# Patient Record
Sex: Male | Born: 1960 | Race: White | Hispanic: No | Marital: Married | Smoking: Current some day smoker
Health system: Southern US, Community
[De-identification: ages and names within clinical notes are randomized; demographics above are authoritative.]

## PROBLEM LIST (undated history)

## (undated) DIAGNOSIS — E785 Hyperlipidemia, unspecified: Secondary | ICD-10-CM

## (undated) DIAGNOSIS — K219 Gastro-esophageal reflux disease without esophagitis: Secondary | ICD-10-CM

## (undated) DIAGNOSIS — M109 Gout, unspecified: Secondary | ICD-10-CM

## (undated) HISTORY — DX: Gastro-esophageal reflux disease without esophagitis: K21.9

## (undated) HISTORY — DX: Gout, unspecified: M10.9

## (undated) HISTORY — DX: Hyperlipidemia, unspecified: E78.5

## (undated) HISTORY — PX: TONSILECTOMY, ADENOIDECTOMY, BILATERAL MYRINGOTOMY AND TUBES: SHX2538

---

## 2013-05-02 ENCOUNTER — Ambulatory Visit: Payer: Self-pay | Admitting: Gastroenterology

## 2014-03-13 DIAGNOSIS — M109 Gout, unspecified: Secondary | ICD-10-CM

## 2014-03-13 DIAGNOSIS — K219 Gastro-esophageal reflux disease without esophagitis: Secondary | ICD-10-CM

## 2014-03-13 DIAGNOSIS — E785 Hyperlipidemia, unspecified: Secondary | ICD-10-CM

## 2014-03-13 HISTORY — DX: Hyperlipidemia, unspecified: E78.5

## 2014-03-13 HISTORY — DX: Gout, unspecified: M10.9

## 2014-03-13 HISTORY — DX: Gastro-esophageal reflux disease without esophagitis: K21.9

## 2016-03-31 ENCOUNTER — Other Ambulatory Visit: Payer: Self-pay | Admitting: Family Medicine

## 2016-03-31 DIAGNOSIS — M5442 Lumbago with sciatica, left side: Principal | ICD-10-CM

## 2016-03-31 DIAGNOSIS — G8929 Other chronic pain: Secondary | ICD-10-CM

## 2016-04-13 ENCOUNTER — Ambulatory Visit
Admission: RE | Admit: 2016-04-13 | Discharge: 2016-04-13 | Disposition: A | Discharge status: Home / Self Care | Payer: Managed Care, Other (non HMO) | Source: Ambulatory Visit | Attending: Family Medicine | Admitting: Family Medicine

## 2016-04-13 DIAGNOSIS — M4806 Spinal stenosis, lumbar region: Secondary | ICD-10-CM | POA: Insufficient documentation

## 2016-04-13 DIAGNOSIS — M5442 Lumbago with sciatica, left side: Secondary | ICD-10-CM | POA: Insufficient documentation

## 2016-04-13 DIAGNOSIS — G8929 Other chronic pain: Secondary | ICD-10-CM | POA: Insufficient documentation

## 2017-02-01 ENCOUNTER — Emergency Department
Admission: EM | Admit: 2017-02-01 | Discharge: 2017-02-02 | Disposition: A | Discharge status: Home / Self Care | Payer: Managed Care, Other (non HMO) | Attending: Emergency Medicine | Admitting: Emergency Medicine

## 2017-02-01 ENCOUNTER — Emergency Department: Payer: Managed Care, Other (non HMO)

## 2017-02-01 DIAGNOSIS — R1032 Left lower quadrant pain: Secondary | ICD-10-CM | POA: Diagnosis present

## 2017-02-01 DIAGNOSIS — F17200 Nicotine dependence, unspecified, uncomplicated: Secondary | ICD-10-CM | POA: Diagnosis not present

## 2017-02-01 DIAGNOSIS — N200 Calculus of kidney: Secondary | ICD-10-CM | POA: Insufficient documentation

## 2017-02-01 LAB — COMPREHENSIVE METABOLIC PANEL
ALBUMIN: 3.4 g/dL — AB (ref 3.5–5.0)
ALK PHOS: 59 U/L (ref 38–126)
ALT: 18 U/L (ref 17–63)
ANION GAP: 9 (ref 5–15)
AST: 25 U/L (ref 15–41)
BUN: 48 mg/dL — ABNORMAL HIGH (ref 6–20)
CALCIUM: 8.8 mg/dL — AB (ref 8.9–10.3)
CO2: 23 mmol/L (ref 22–32)
CREATININE: 1.83 mg/dL — AB (ref 0.61–1.24)
Chloride: 106 mmol/L (ref 101–111)
GFR, EST AFRICAN AMERICAN: 47 mL/min — AB (ref >60)
GFR, EST NON AFRICAN AMERICAN: 40 mL/min — AB (ref >60)
Glucose, Bld: 179 mg/dL — ABNORMAL HIGH (ref 65–99)
Potassium: 4.3 mmol/L (ref 3.5–5.1)
Sodium: 138 mmol/L (ref 135–145)
Total Bilirubin: 0.7 mg/dL (ref 0.3–1.2)
Total Protein: 6.6 g/dL (ref 6.5–8.1)

## 2017-02-01 LAB — CBC
HCT: 37.6 % — ABNORMAL LOW (ref 40.0–52.0)
HEMOGLOBIN: 12.4 g/dL — AB (ref 13.0–18.0)
MCH: 27.8 pg (ref 26.0–34.0)
MCHC: 33.1 g/dL (ref 32.0–36.0)
MCV: 84.1 fL (ref 80.0–100.0)
PLATELETS: 257 10*3/uL (ref 150–440)
RBC: 4.47 MIL/uL (ref 4.40–5.90)
RDW: 13.4 % (ref 11.5–14.5)
WBC: 17.7 10*3/uL — AB (ref 3.8–10.6)

## 2017-02-01 LAB — TROPONIN I

## 2017-02-01 LAB — LIPASE, BLOOD: Lipase: 29 U/L (ref 11–51)

## 2017-02-01 MED ORDER — ONDANSETRON HCL 4 MG/2ML IJ SOLN
INTRAMUSCULAR | Status: AC
  Administered 2017-02-01: 4 mg via INTRAVENOUS
  Filled 2017-02-01: qty 2

## 2017-02-01 MED ORDER — SODIUM CHLORIDE 0.9 % IV BOLUS (SEPSIS)
1000.0000 mL | Freq: Once | INTRAVENOUS | Status: AC
  Administered 2017-02-01: 1000 mL via INTRAVENOUS

## 2017-02-01 MED ORDER — ONDANSETRON HCL 4 MG/2ML IJ SOLN
4.0000 mg | Freq: Once | INTRAMUSCULAR | Status: AC
  Administered 2017-02-01: 4 mg via INTRAVENOUS

## 2017-02-01 MED ORDER — MORPHINE SULFATE (PF) 2 MG/ML IV SOLN
2.0000 mg | Freq: Once | INTRAVENOUS | Status: AC
  Administered 2017-02-01: 2 mg via INTRAVENOUS
  Filled 2017-02-01: qty 1

## 2017-02-01 MED ORDER — IOPAMIDOL (ISOVUE-300) INJECTION 61%
75.0000 mL | Freq: Once | INTRAVENOUS | Status: AC | PRN
  Administered 2017-02-01: 75 mL via INTRAVENOUS

## 2017-02-01 NOTE — ED Provider Notes (Signed)
[facility] Emergency Department Provider Note    First MD Initiated Contact with Patient 02/01/17 2306     (approximate)  I have reviewed the triage vital signs and the nursing notes.   HISTORY  Chief Complaint Abdominal Pain and Dizziness    HPI Juan Christian is a 56 y.o. male presents to the emergency department with left flank/left lower quadrant abdominal pain that is currently 4 out of 10 hour patient states at maximum intensity pain was 10 out of 10. Patient states that this pain started on Sunday and had improved until last night. Patient admits to vomiting however no diarrhea does admit to constipation. Patient denies any fever. Patient denies any urinary symptoms including hematuria dysuria urgency or frequency. Patient denies any personal history of kidney stones or diverticulitis however does admit to a family history of both.  Past medical history Hyperlipidemia There are no active problems to display for this patient.   Past surgical history None  Prior to Admission medications   Medication Sig Start Date End Date Taking? Authorizing Provider  ondansetron (ZOFRAN ODT) 4 MG disintegrating tablet Take 1 tablet (4 mg total) by mouth every 8 (eight) hours as needed for nausea or vomiting. 02/02/17   Darci Current, MD  oxyCODONE-acetaminophen (ROXICET) 5-325 MG tablet Take 1 tablet by mouth every 4 (four) hours as needed for severe pain. 02/02/17   Darci Current, MD  tamsulosin (FLOMAX) 0.4 MG CAPS capsule Take 1 capsule (0.4 mg total) by mouth daily. 02/02/17   Darci Current, MD    Allergies No known drug allergies   Family history Kidney stones  Social History Social History  Substance Use Topics  . Smoking status: Current Some Day Smoker  . Smokeless tobacco: Never Used  . Alcohol use No    Review of Systems {Constitutional: No fever/chills Eyes: No visual changes. ENT: No sore throat. Cardiovascular: Denies chest  pain. Respiratory: Denies shortness of breath. Gastrointestinal: Positive for left lower quadrant/flank abdominal pain and vomiting. Genitourinary: Negative for dysuria. Musculoskeletal: Negative for neck pain.  Negative for back pain. Integumentary: Negative for rash. Neurological: Negative for headaches, focal weakness or numbness. Positive for dizziness   ____________________________________________   PHYSICAL EXAM:  VITAL SIGNS: ED Triage Vitals  Enc Vitals Group     BP 02/01/17 2235 109/75     Pulse Rate 02/01/17 2235 (!) 125     Resp 02/01/17 2235 18     Temp 02/01/17 2235 97.8 F (36.6 C)     Temp Source 02/01/17 2235 Oral     SpO2 02/01/17 2235 97 %     Weight 02/01/17 2225 126.1 kg (278 lb)     Height 02/01/17 2225 1.676 m (5\' 6" )     Head Circumference --      Peak Flow --      Pain Score 02/01/17 2225 2     Pain Loc --      Pain Edu? --    Constitutional: Alert and oriented. Well appearing and in no acute distress. Eyes: Conjunctivae are normal. PERRL. EOMI. Head: Atraumatic. Mouth/Throat: Mucous membranes are moist.  Oropharynx non-erythematous. Neck: No stridor.  Cardiovascular: Normal rate, regular rhythm. Good peripheral circulation. Grossly normal heart sounds. Respiratory: Normal respiratory effort.  No retractions. Lungs CTAB. Gastrointestinal: Soft and nontender. No distention.  Musculoskeletal: No lower extremity tenderness nor edema. No gross deformities of extremities. Neurologic:  Normal speech and language. No gross focal neurologic deficits are appreciated.  Skin:  Skin is warm, dry and intact. No rash noted. Psychiatric: Mood and affect are normal. Speech and behavior are normal.  ____________________________________________   LABS (all labs ordered are listed, but only abnormal results are displayed)  Labs Reviewed  COMPREHENSIVE METABOLIC PANEL - Abnormal; Notable for the following:       Result Value   Glucose, Bld 179 (*)    BUN 48  (*)    Creatinine, Ser 1.83 (*)    Calcium 8.8 (*)    Albumin 3.4 (*)    GFR calc non Af Amer 40 (*)    GFR calc Af Amer 47 (*)    All other components within normal limits  CBC - Abnormal; Notable for the following:    WBC 17.7 (*)    Hemoglobin 12.4 (*)    HCT 37.6 (*)    All other components within normal limits  URINALYSIS, COMPLETE (UACMP) WITH MICROSCOPIC - Abnormal; Notable for the following:    Color, Urine STRAW (*)    APPearance CLEAR (*)    Specific Gravity, Urine 1.039 (*)    Ketones, ur 5 (*)    Squamous Epithelial / LPF 0-5 (*)    All other components within normal limits  LIPASE, BLOOD  TROPONIN I   ____________________________________________  EKG  ED ECG REPORT I, Diablock N BROWN, the attending physician, personally viewed and interpreted this ECG.   Date: 02/01/2017  EKG Time: 10:36 PM  Rate: 122  Rhythm: Sinus tachycardia  Axis: Normal  Intervals: Normal  ST&T Change: None  ____________________________________________  RADIOLOGY I, Alger N BROWN, personally viewed and evaluated these images (plain radiographs) as part of my medical decision making, as well as reviewing the written report by the radiologist.  Ct Abdomen Pelvis W Contrast  Result Date: 02/02/2017 CLINICAL DATA:  Left lower quadrant pain with vomiting EXAM: CT ABDOMEN AND PELVIS WITH CONTRAST TECHNIQUE: Multidetector CT imaging of the abdomen and pelvis was performed using the standard protocol following bolus administration of intravenous contrast. CONTRAST:  75mL ISOVUE-300 IOPAMIDOL (ISOVUE-300) INJECTION 61% COMPARISON:  MRI  04/13/2016 FINDINGS: Lower chest: Low lung bases demonstrate no acute consolidation or pleural effusion. Normal heart size. Trace pericardial effusion or thickening Hepatobiliary: Hepatic steatosis. No calcified gallstones or biliary dilatation Pancreas: Unremarkable. No pancreatic ductal dilatation or surrounding inflammatory changes. Spleen: Normal in size  without focal abnormality. Adrenals/Urinary Tract: Adrenal glands are within normal limits. Delayed nephrogram on the left with poor excretion of contrast on the delayed views. Mild left hydronephrosis and hydroureter, secondary to a 4 mm stone within the distal left ureter, just above the left UVJ. Soft tissue stranding in the left retroperitoneum. Bladder normal. Stomach/Bowel: Frothy debris in the stomach. No dilated small bowel. Normal appendix. Sigmoid colon diverticula without acute inflammation Vascular/Lymphatic: No significant vascular findings are present. No enlarged abdominal or pelvic lymph nodes. Reproductive: Prostate is unremarkable. Other: Small fat in the left inguinal canal. Tiny fat in the umbilicus. 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 Chronic compression deformity of L1. IMPRESSION: 1. Mild left hydronephrosis and hydroureter, secondary to a 4 mm stone in the distal left ureter just above the left UVJ. Delayed left nephrogram and poor excretion of contrast, likely due to the obstruction. 2. Sigmoid colon diverticular disease without acute inflammation 3. Grade 1 anterolisthesis of L5 on S1 with chronic bilateral pars defect at L5. Electronically Signed   By: Adrian ProwsKim  Fujinaga M.D.  On: 02/02/2017 00:06      Procedures   ____________________________________________   INITIAL IMPRESSION / ASSESSMENT AND PLAN / ED COURSE  Pertinent labs & imaging results that were available during my care of the patient were reviewed by me and considered in my medical decision making (see chart for details).  Patient given IV morphine and Zofran emergency Department with improvement of pain. History of physical exam concerning for possible nephro/ureterolithiasis which was confirmed with CT scan. Patient will be referred to allergist on call for further outpatient management.       ____________________________________________  FINAL CLINICAL IMPRESSION(S) / ED DIAGNOSES  Final diagnoses:  Kidney stone on left side     MEDICATIONS GIVEN DURING THIS VISIT:  Medications  sodium chloride 0.9 % bolus 1,000 mL (0 mLs Intravenous Stopped 02/02/17 0054)  sodium chloride 0.9 % bolus 1,000 mL (0 mLs Intravenous Stopped 02/02/17 0054)  morphine 2 MG/ML injection 2 mg (2 mg Intravenous Given 02/01/17 2320)  ondansetron (ZOFRAN) injection 4 mg (4 mg Intravenous Given 02/01/17 2320)  iopamidol (ISOVUE-300) 61 % injection 75 mL (75 mLs Intravenous Contrast Given 02/01/17 2338)  sodium chloride 0.9 % bolus 1,000 mL (0 mLs Intravenous Stopped 02/02/17 0258)     NEW OUTPATIENT MEDICATIONS STARTED DURING THIS VISIT:  Discharge Medication List as of 02/02/2017  2:48 AM    START taking these medications   Details  ondansetron (ZOFRAN ODT) 4 MG disintegrating tablet Take 1 tablet (4 mg total) by mouth every 8 (eight) hours as needed for nausea or vomiting., Starting Thu 02/02/2017, Print    oxyCODONE-acetaminophen (ROXICET) 5-325 MG tablet Take 1 tablet by mouth every 4 (four) hours as needed for severe pain., Starting Thu 02/02/2017, Print    tamsulosin (FLOMAX) 0.4 MG CAPS capsule Take 1 capsule (0.4 mg total) by mouth daily., Starting Thu 02/02/2017, Print        Discharge Medication List as of 02/02/2017  2:48 AM      Discharge Medication List as of 02/02/2017  2:48 AM       Note:  This document was prepared using Dragon voice recognition software and may include unintentional dictation errors.    Darci Current, MD 02/02/17 [phone]

## 2017-02-01 NOTE — ED Notes (Signed)
Patient transported to CT 

## 2017-02-02 LAB — URINALYSIS, COMPLETE (UACMP) WITH MICROSCOPIC
BACTERIA UA: NONE SEEN
Bilirubin Urine: NEGATIVE
GLUCOSE, UA: NEGATIVE mg/dL
Hgb urine dipstick: NEGATIVE
Ketones, ur: 5 mg/dL — AB
Leukocytes, UA: NEGATIVE
Nitrite: NEGATIVE
PROTEIN: NEGATIVE mg/dL
RBC / HPF: NONE SEEN RBC/hpf (ref 0–5)
Specific Gravity, Urine: 1.039 — ABNORMAL HIGH (ref 1.005–1.030)
pH: 5 (ref 5.0–8.0)

## 2017-02-02 MED ORDER — SODIUM CHLORIDE 0.9 % IV BOLUS (SEPSIS)
1000.0000 mL | Freq: Once | INTRAVENOUS | Status: AC
  Administered 2017-02-02: 1000 mL via INTRAVENOUS

## 2017-02-02 MED ORDER — ONDANSETRON 4 MG PO TBDP: 4.0000 mg | ORAL_TABLET | Freq: Three times a day (TID) | ORAL | 0 refills | Status: DC | PRN

## 2017-02-02 MED ORDER — OXYCODONE-ACETAMINOPHEN 5-325 MG PO TABS: 1.0000 | ORAL_TABLET | ORAL | 0 refills | Status: DC | PRN

## 2017-02-02 MED ORDER — TAMSULOSIN HCL 0.4 MG PO CAPS: 0.4000 mg | ORAL_CAPSULE | Freq: Every day | ORAL | 0 refills | Status: DC

## 2017-02-09 ENCOUNTER — Encounter: Payer: Self-pay | Admitting: Urology

## 2017-02-09 ENCOUNTER — Ambulatory Visit (INDEPENDENT_AMBULATORY_CARE_PROVIDER_SITE_OTHER): Payer: Managed Care, Other (non HMO) | Admitting: Urology

## 2017-02-09 VITALS — BP 129/81 | HR 109 | Ht 66.0 in | Wt 276.3 lb

## 2017-02-09 DIAGNOSIS — N400 Enlarged prostate without lower urinary tract symptoms: Secondary | ICD-10-CM

## 2017-02-09 DIAGNOSIS — N200 Calculus of kidney: Secondary | ICD-10-CM

## 2017-02-09 LAB — URINALYSIS, COMPLETE
Bilirubin, UA: NEGATIVE
Glucose, UA: NEGATIVE
Ketones, UA: NEGATIVE
LEUKOCYTES UA: NEGATIVE
NITRITE UA: NEGATIVE
PH UA: 6 (ref 5.0–7.5)
Protein, UA: NEGATIVE
RBC UA: NEGATIVE
Specific Gravity, UA: 1.015 (ref 1.005–1.030)
Urobilinogen, Ur: 0.2 mg/dL (ref 0.2–1.0)

## 2017-02-09 LAB — MICROSCOPIC EXAMINATION
BACTERIA UA: NONE SEEN
EPITHELIAL CELLS (NON RENAL): NONE SEEN /HPF (ref 0–10)

## 2017-02-09 MED ORDER — TAMSULOSIN HCL 0.4 MG PO CAPS: 0.4000 mg | ORAL_CAPSULE | Freq: Every day | ORAL | 11 refills | Status: DC

## 2017-02-09 NOTE — Progress Notes (Signed)
02/09/2017 12:13 PM   Juan GashMark R Daoust 31-Aug-1960 [id number]  Referring provider: Marina GoodellFeldpausch, Dale E, MD [address] [place], [place] [id number]  Chief Complaint  Patient presents with  . Nephrolithiasis    HPI: The patient is a 56 year old gentle man presents today for follow-up after being diagnosed in the emergency department with a 4 mm left UVJ stone.  Ureteral present with sudden onset left flank pain. He also had significant nausea and vomiting. He denied fevers or chills. His pain is since resolved. He has not seen the stone passed though he has not been straining his urine. He has no previous history of nephrolithiasis though he does have a family history in his father. Currently she remains asymptomatic.  The patient was given a short course of Flomax to help him pass the stone. He is requesting to restart this medication because he saw a significant improvement in his urinary issues and specifically with his nocturia. Completely resolve his nocturia, improved his urinary stream, and gave a better sensation of incomplete bladder emptying. He has never been on medication for BPH prior to this. He saw immediate return of his symptoms once he ran out of his short course of Flomax that was given for his ureteral calculus.   PMH: History reviewed. No pertinent past medical history.  Surgical History: Past Surgical History:  Procedure Laterality Date  . TONSILECTOMY, ADENOIDECTOMY, BILATERAL MYRINGOTOMY AND TUBES      Home Medications:  Allergies as of 02/09/2017   No Known Allergies     Medication List       Accurate as of 02/09/17 12:13 PM. Always use your most recent med list.          ondansetron 4 MG disintegrating tablet Commonly known as:  ZOFRAN ODT Take 1 tablet (4 mg total) by mouth every 8 (eight) hours as needed for nausea or vomiting.   oxyCODONE-acetaminophen 5-325 MG tablet Commonly known as:  ROXICET Take 1 tablet by mouth every 4 (four) hours as needed  for severe pain.   tamsulosin 0.4 MG Caps capsule Commonly known as:  FLOMAX Take 1 capsule (0.4 mg total) by mouth daily.   tamsulosin 0.4 MG Caps capsule Commonly known as:  FLOMAX Take 1 capsule (0.4 mg total) by mouth daily.       Allergies: No Known Allergies  Family History: Family History  Problem Relation Age of Onset  . Prostate cancer Neg Hx   . Bladder Cancer Neg Hx   . Kidney cancer Neg Hx     Social History:  reports that he has been smoking.  He has never used smokeless tobacco. He reports that he does not drink alcohol or use drugs.  ROS: UROLOGY Frequent Urination?: No Hard to postpone urination?: Yes Burning/pain with urination?: No Get up at night to urinate?: Yes Leakage of urine?: Yes Urine stream starts and stops?: No Trouble starting stream?: No Do you have to strain to urinate?: No Blood in urine?: No Urinary tract infection?: No Sexually transmitted disease?: No Injury to kidneys or bladder?: No Painful intercourse?: No Weak stream?: No Erection problems?: No Penile pain?: No  Gastrointestinal Nausea?: Yes Vomiting?: Yes Indigestion/heartburn?: Yes Diarrhea?: No Constipation?: No  Constitutional Fever: No Night sweats?: No Weight loss?: No Fatigue?: No  Skin Skin rash/lesions?: No Itching?: No  Eyes Blurred vision?: No Double vision?: No  Ears/Nose/Throat Sore throat?: No Sinus problems?: No  Hematologic/Lymphatic Swollen glands?: No Easy bruising?: No  Cardiovascular Leg swelling?: No Chest pain?:  No  Respiratory Cough?: No Shortness of breath?: No  Endocrine Excessive thirst?: No  Musculoskeletal Back pain?: No Joint pain?: No  Neurological Headaches?: No Dizziness?: No  Psychologic Depression?: No Anxiety?: No  Physical Exam: BP 129/81 (BP Location: Left Arm, Patient Position: Sitting, Cuff Size: Normal)   Pulse (!) 109   Ht 5\' 6"  (1.676 m)   Wt 276 lb 4.8 oz (125.3 kg)   BMI 44.60 kg/m     Constitutional:  Alert and oriented, No acute distress. HEENT: [place] AT, moist mucus membranes.  Trachea midline, no masses. Cardiovascular: No clubbing, cyanosis, or edema. Respiratory: Normal respiratory effort, no increased work of breathing. GI: Abdomen is soft, nontender, nondistended, no abdominal masses GU: No CVA tenderness.  Skin: No rashes, bruises or suspicious lesions. Lymph: No cervical or inguinal adenopathy. Neurologic: Grossly intact, no focal deficits, moving all 4 extremities. Psychiatric: Normal mood and affect.  Laboratory Data: Lab Results  Component Value Date   WBC 17.7 (H) 02/01/2017   HGB 12.4 (L) 02/01/2017   HCT 37.6 (L) 02/01/2017   MCV 84.1 02/01/2017   PLT 257 02/01/2017    Lab Results  Component Value Date   CREATININE 1.83 (H) 02/01/2017    No results found for: PSA  No results found for: TESTOSTERONE  No results found for: HGBA1C  Urinalysis    Component Value Date/Time   COLORURINE STRAW (A) 02/01/2017 2237   APPEARANCEUR CLEAR (A) 02/01/2017 2237   LABSPEC 1.039 (H) 02/01/2017 2237   PHURINE 5.0 02/01/2017 2237   GLUCOSEU NEGATIVE 02/01/2017 2237   HGBUR NEGATIVE 02/01/2017 2237   BILIRUBINUR NEGATIVE 02/01/2017 2237   KETONESUR 5 (A) 02/01/2017 2237   PROTEINUR NEGATIVE 02/01/2017 2237   NITRITE NEGATIVE 02/01/2017 2237   LEUKOCYTESUR NEGATIVE 02/01/2017 2237    Pertinent Imaging: CT images and report reviewed as above.  Assessment & Plan:    1. Left 4 mm UVJ stone It is unclear if the patient has passed his stone at this point though he remains asymptomatic. He was given a strainer. We will have him undergo a KUB in approximately 2 weeks in follow-up at that point. He will strain his urine and bring in the stone if he is able to retrieve it.  2. BPH We'll reinstitute Flomax as the patient noted dramatic improvement in his urinary symptoms when this medication was prescribed for obstructive uropathy.  Return in about 2  weeks (around 02/23/2017) for KUB prior.  Hildred Laser, MD  [facility] [address] [place], [place] [address] [phone]

## 2017-02-24 ENCOUNTER — Ambulatory Visit
Admission: RE | Admit: 2017-02-24 | Discharge: 2017-02-24 | Disposition: A | Discharge status: Home / Self Care | Payer: Managed Care, Other (non HMO) | Source: Ambulatory Visit | Attending: Urology | Admitting: Urology

## 2017-02-24 ENCOUNTER — Ambulatory Visit (INDEPENDENT_AMBULATORY_CARE_PROVIDER_SITE_OTHER): Payer: Managed Care, Other (non HMO) | Admitting: Urology

## 2017-02-24 VITALS — BP 127/87 | HR 114 | Resp 12 | Ht 67.0 in | Wt 278.0 lb

## 2017-02-24 DIAGNOSIS — Z87442 Personal history of urinary calculi: Secondary | ICD-10-CM | POA: Insufficient documentation

## 2017-02-24 DIAGNOSIS — N200 Calculus of kidney: Secondary | ICD-10-CM

## 2017-02-24 DIAGNOSIS — N400 Enlarged prostate without lower urinary tract symptoms: Secondary | ICD-10-CM | POA: Diagnosis not present

## 2017-02-24 NOTE — Progress Notes (Signed)
02/24/2017 3:23 PM   Juan Christian Oct 29, 1960 [id number]  Referring provider: Marina GoodellFeldpausch, Dale E, MD [address] [place], [place] [id number]  Chief Complaint  Patient presents with  . Follow-up    KUB prior    HPI: The patient is a 56 year old gentle man presents today for follow-up of his 4 mm left UVJ stone.  Patient remains asymptomatic at this point. KUB from today shows no evidence of nephrolithiasis. This was his first stone episode of there is a family history his father for nephrolithiasis.  The patient is also currently on Flomax. This initially started for his nephrolithiasis. He noticed a dramatic improvement in his urinary symptoms on this medication, so it has been continued.   PMH: No past medical history on file.  Surgical History: Past Surgical History:  Procedure Laterality Date  . TONSILECTOMY, ADENOIDECTOMY, BILATERAL MYRINGOTOMY AND TUBES      Home Medications:  Allergies as of 02/24/2017   No Known Allergies     Medication List       Accurate as of 02/24/17  3:23 PM. Always use your most recent med list.          tamsulosin 0.4 MG Caps capsule Commonly known as:  FLOMAX Take 1 capsule (0.4 mg total) by mouth daily.       Allergies: No Known Allergies  Family History: Family History  Problem Relation Age of Onset  . Prostate cancer Neg Hx   . Bladder Cancer Neg Hx   . Kidney cancer Neg Hx     Social History:  reports that he has been smoking.  He has never used smokeless tobacco. He reports that he does not drink alcohol or use drugs.  ROS: UROLOGY Frequent Urination?: No Hard to postpone urination?: No Burning/pain with urination?: No Get up at night to urinate?: Yes Leakage of urine?: Yes Urine stream starts and stops?: No Trouble starting stream?: No Do you have to strain to urinate?: No Blood in urine?: No Urinary tract infection?: No Sexually transmitted disease?: No Injury to kidneys or bladder?: No Painful  intercourse?: No Weak stream?: No Erection problems?: No Penile pain?: Yes  Gastrointestinal Nausea?: No Vomiting?: No Indigestion/heartburn?: No Diarrhea?: No Constipation?: No  Constitutional Fever: No Night sweats?: No Weight loss?: No Fatigue?: No  Skin Skin rash/lesions?: No Itching?: No  Eyes Blurred vision?: No Double vision?: No  Ears/Nose/Throat Sore throat?: No Sinus problems?: No  Hematologic/Lymphatic Swollen glands?: No Easy bruising?: No  Cardiovascular Leg swelling?: No Chest pain?: No  Respiratory Cough?: No Shortness of breath?: No  Endocrine Excessive thirst?: No  Musculoskeletal Back pain?: No Joint pain?: No  Neurological Headaches?: No Dizziness?: No  Psychologic Depression?: No Anxiety?: No  Physical Exam: BP 127/87   Pulse (!) 114   Resp 12   Ht 5\' 7"  (1.702 m)   Wt 278 lb (126.1 kg)   BMI 43.54 kg/m   Constitutional:  Alert and oriented, No acute distress. HEENT:  AT, moist mucus membranes.  Trachea midline, no masses. Cardiovascular: No clubbing, cyanosis, or edema. Respiratory: Normal respiratory effort, no increased work of breathing. GI: Abdomen is soft, nontender, nondistended, no abdominal masses GU: No CVA tenderness.  Skin: No rashes, bruises or suspicious lesions. Lymph: No cervical or inguinal adenopathy. Neurologic: Grossly intact, no focal deficits, moving all 4 extremities. Psychiatric: Normal mood and affect.  Laboratory Data: Lab Results  Component Value Date   WBC 17.7 (H) 02/01/2017   HGB 12.4 (L) 02/01/2017   HCT  37.6 (L) 02/01/2017   MCV 84.1 02/01/2017   PLT 257 02/01/2017    Lab Results  Component Value Date   CREATININE 1.83 (H) 02/01/2017    No results found for: PSA  No results found for: TESTOSTERONE  No results found for: HGBA1C  Urinalysis    Component Value Date/Time   COLORURINE STRAW (A) 02/01/2017 2237   APPEARANCEUR Clear 02/09/2017 1131   LABSPEC 1.039 (H)  02/01/2017 2237   PHURINE 5.0 02/01/2017 2237   GLUCOSEU Negative 02/09/2017 1131   HGBUR NEGATIVE 02/01/2017 2237   BILIRUBINUR Negative 02/09/2017 1131   KETONESUR 5 (A) 02/01/2017 2237   PROTEINUR Negative 02/09/2017 1131   PROTEINUR NEGATIVE 02/01/2017 2237   NITRITE Negative 02/09/2017 1131   NITRITE NEGATIVE 02/01/2017 2237   LEUKOCYTESUR Negative 02/09/2017 1131    Pertinent Imaging: KUB and previous CT reviewed as above.  Assessment & Plan:   1. Left ureteral stone This is clinically passed based on patient's symptoms and KUB today. If his pain however were to return, we could order a renal ultrasound at that time.  2. BPH Continue Flomax. Follow up annually.  Return in about 1 year (around 02/24/2018).  Hildred Laser, MD  [facility] [address] [place], [place] [address] [phone]

## 2017-05-15 ENCOUNTER — Telehealth: Payer: Self-pay

## 2017-05-15 DIAGNOSIS — N200 Calculus of kidney: Secondary | ICD-10-CM

## 2017-05-15 NOTE — Telephone Encounter (Signed)
Pt wife called the after hours triage line stating pt is having left flank and groin pain again. Wife stated that she is with the understanding pt KUB was negative.  Returned wife's call. Wife stated that pt has been pushing fluids and feels some better but really sore in flank and groin area now. Wife stated that pt has not seen a stone. Reinforced with wife per Dr. Jorja Loa dictation from July if pain returns can orders a RUS. Wife stated that she would prefer pt to have a RUS and if its negative then great. Reinforced with wife RUS orders and scheduling process. Wife voiced understanding. RUS orders placed.

## 2017-06-02 ENCOUNTER — Ambulatory Visit
Admission: RE | Admit: 2017-06-02 | Discharge: 2017-06-02 | Disposition: A | Discharge status: Home / Self Care | Payer: Managed Care, Other (non HMO) | Source: Ambulatory Visit | Attending: Urology | Admitting: Urology

## 2017-06-02 DIAGNOSIS — N200 Calculus of kidney: Secondary | ICD-10-CM | POA: Diagnosis not present

## 2017-06-08 ENCOUNTER — Telehealth: Payer: Self-pay | Admitting: Family Medicine

## 2017-06-08 NOTE — Telephone Encounter (Signed)
Marcelino DusterMichelle spoke with patients wife and gave results. She voiced understanding. They had no questions.

## 2017-06-08 NOTE — Telephone Encounter (Signed)
-----   Message from Hildred LaserBrian James Budzyn, MD sent at 06/08/2017  1:01 PM EDT ----- Please let patient know renal u/s is normal.   ----- Message ----- From: Mervin KungWilliams, Ramona K, CMA Sent: 06/02/2017   4:27 PM To: Hildred LaserBrian James Budzyn, MD    ----- Message ----- From: Interface, Rad Results In Sent: 06/02/2017   4:17 PM To: Jennette KettleBua Clinical

## 2018-03-01 ENCOUNTER — Ambulatory Visit: Payer: Managed Care, Other (non HMO)

## 2018-03-08 NOTE — Progress Notes (Signed)
03/09/2018 9:45 AM   Juan Christian Dec 27, 1960 [id number]  Referring provider: Marina Goodell, MD [address] [place], [place] [address]  Chief Complaint  Patient presents with  . Benign Prostatic Hypertrophy    HPI: Patient is a 57 year old gentleman with a history of nephrolithiasis and BPH with LU TS who presents today for a one year follow up.  History of nephrolithiasis Spontaneously passed a 4 mm left UVJ stone in 01/2017.  Stone composition is unknown.  His father and mother have had kidney stones.  He is fearful that the stone has not passed as he never saw it come out.  He would like another RUS.    BPH WITH LUTS  (prostate and/or bladder) IPSS score: 8/3   PVR: 0 mL     Major complaint(s):  Works the night shift, mild urge incontinence and a weak stream x a while.  Denies any dysuria, hematuria or suprapubic pain.   He had been on the tamsulosin 0.4 mg daily, he has been without it for one month.    Denies any recent fevers, chills, nausea or vomiting.  His father had his prostate removed due to BPH.    IPSS    Row Name 03/09/18 0900         International Prostate Symptom Score   How often have you had the sensation of not emptying your bladder?  Less than 1 in 5     How often have you found you stopped and started again several times when you urinated?  Not at All     How often have you found it difficult to postpone urination?  Less than 1 in 5 times     How often have you had a weak urinary stream?  About half the time     How often have you had to strain to start urination?  Less than 1 in 5 times     How many times did you typically get up at night to urinate?  2 Times     Total IPSS Score  8       Quality of Life due to urinary symptoms   If you were to spend the rest of your life with your urinary condition just the way it is now how would you feel about that?  Mixed        Score:  1-7 Mild 8-19 Moderate 20-35 Severe    PMH: Past  Medical History:  Diagnosis Date  . GERD (gastroesophageal reflux disease) 03/13/2014  . Gout 03/13/2014  . Hyperlipidemia 03/13/2014    Surgical History: Past Surgical History:  Procedure Laterality Date  . TONSILECTOMY, ADENOIDECTOMY, BILATERAL MYRINGOTOMY AND TUBES      Home Medications:  Allergies as of 03/09/2018   No Known Allergies     Medication List        Accurate as of 03/09/18  9:45 AM. Always use your most recent med list.          simvastatin 10 MG tablet Commonly known as:  ZOCOR TAKE 1 TABLET BY MOUTH NIGHTLY   tamsulosin 0.4 MG Caps capsule Commonly known as:  FLOMAX Take 1 capsule (0.4 mg total) by mouth daily.       Allergies: No Known Allergies  Family History: Family History  Problem Relation Age of Onset  . Prostate cancer Neg Hx   . Bladder Cancer Neg Hx   . Kidney cancer Neg Hx     Social History:  reports that he has been smoking.  He has never used smokeless tobacco. He reports that he does not drink alcohol or use drugs.  ROS: UROLOGY Frequent Urination?: No Hard to postpone urination?: No Burning/pain with urination?: No Get up at night to urinate?: Yes Leakage of urine?: Yes Urine stream starts and stops?: No Trouble starting stream?: No Do you have to strain to urinate?: No Blood in urine?: No Urinary tract infection?: No Sexually transmitted disease?: No Injury to kidneys or bladder?: No Painful intercourse?: No Weak stream?: Yes Erection problems?: No Penile pain?: No  Gastrointestinal Nausea?: No Vomiting?: No Indigestion/heartburn?: No Diarrhea?: No Constipation?: No  Constitutional Fever: No Night sweats?: No Weight loss?: No Fatigue?: No  Skin Skin rash/lesions?: No Itching?: No  Eyes Blurred vision?: No Double vision?: No  Ears/Nose/Throat Sore throat?: No Sinus problems?: No  Hematologic/Lymphatic Swollen glands?: No Easy bruising?: No  Cardiovascular Leg swelling?: No Chest pain?:  No  Respiratory Cough?: No Shortness of breath?: No  Endocrine Excessive thirst?: No  Musculoskeletal Back pain?: No Joint pain?: No  Neurological Headaches?: No Dizziness?: No  Psychologic Depression?: No Anxiety?: No  Physical Exam: BP (!) 139/98   Pulse 86   Ht 5\' 6"  (1.676 m)   Wt 270 lb (122.5 kg)   BMI 43.58 kg/m   Constitutional: Well nourished. Alert and oriented, No acute distress. HEENT: [place] AT, moist mucus membranes. Trachea midline, no masses. Cardiovascular: No clubbing, cyanosis, or edema. Respiratory: Normal respiratory effort, no increased work of breathing. GI: Abdomen is soft, non tender, non distended, no abdominal masses. Liver and spleen not palpable.  No hernias appreciated.  Stool sample for occult testing is not indicated.   GU: No CVA tenderness.  No bladder fullness or masses.  Patient with uncircumcised phallus.  Foreskin easily retracted  Urethral meatus is patent.  No penile discharge. No penile lesions or rashes. Scrotum without lesions, cysts, rashes and/or edema.  Testicles are located scrotally bilaterally. No masses are appreciated in the testicles. Left and right epididymis are normal. Rectal: Patient with  normal sphincter tone. Anus and perineum without scarring or rashes. No rectal masses are appreciated. Prostate is approximately 50 grams, could only palpate apex and midportion of gland due to buttock tissue, no nodules are appreciated. Seminal vesicles are normal. Skin: No rashes, bruises or suspicious lesions. Lymph: No cervical or inguinal adenopathy. Neurologic: Grossly intact, no focal deficits, moving all 4 extremities. Psychiatric: Normal mood and affect.  Laboratory Data: PSA   1.74 in 04/14/2017 Lab Results  Component Value Date   WBC 17.7 (H) 02/01/2017   HGB 12.4 (L) 02/01/2017   HCT 37.6 (L) 02/01/2017   MCV 84.1 02/01/2017   PLT 257 02/01/2017    Lab Results  Component Value Date   CREATININE 1.83 (H)  02/01/2017    No results found for: PSA  No results found for: TESTOSTERONE  No results found for: HGBA1C  Urinalysis    Component Value Date/Time   COLORURINE STRAW (A) 02/01/2017 2237   APPEARANCEUR Clear 02/09/2017 1131   LABSPEC 1.039 (H) 02/01/2017 2237   PHURINE 5.0 02/01/2017 2237   GLUCOSEU Negative 02/09/2017 1131   HGBUR NEGATIVE 02/01/2017 2237   BILIRUBINUR Negative 02/09/2017 1131   KETONESUR 5 (A) 02/01/2017 2237   PROTEINUR Negative 02/09/2017 1131   PROTEINUR NEGATIVE 02/01/2017 2237   NITRITE Negative 02/09/2017 1131   NITRITE NEGATIVE 02/01/2017 2237   LEUKOCYTESUR Negative 02/09/2017 1131    I have reviewed the labs.  Pertinent  Imaging Results for AUNDREA, HIGGINBOTHAM (MRN [id number]) as of 03/09/2018 09:46  Ref. Range 03/09/2018 09:44  Scan Result Unknown 0ml    Assessment & Plan:   1. History of nephrolithiasis Patient is concerned about silent hydronephrosis as he had never captured the stone and he has heard this can happen Obtain a RUS - will call with results   2. BPH with LUTS IPSS score is 8/3 Continue conservative management, avoiding bladder irritants and timed voiding's Most bothersome symptoms is/are weak stream Continue tamsulosin 0.4 mg daily:refills given RTC in 12 months for I PSS and exam PSA followed by PCP  Return in about 1 year (around 03/10/2019) for IPSS and exam.  Michiel Cowboy, [facility]  [facility] [address] [place], [place] [address] [phone]

## 2018-03-09 ENCOUNTER — Encounter: Payer: Self-pay | Admitting: Urology

## 2018-03-09 ENCOUNTER — Ambulatory Visit (INDEPENDENT_AMBULATORY_CARE_PROVIDER_SITE_OTHER): Payer: Managed Care, Other (non HMO) | Admitting: Urology

## 2018-03-09 VITALS — BP 139/98 | HR 86 | Ht 66.0 in | Wt 270.0 lb

## 2018-03-09 DIAGNOSIS — N138 Other obstructive and reflux uropathy: Secondary | ICD-10-CM | POA: Diagnosis not present

## 2018-03-09 DIAGNOSIS — N401 Enlarged prostate with lower urinary tract symptoms: Secondary | ICD-10-CM | POA: Diagnosis not present

## 2018-03-09 DIAGNOSIS — Z87442 Personal history of urinary calculi: Secondary | ICD-10-CM

## 2018-03-09 LAB — BLADDER SCAN AMB NON-IMAGING

## 2018-03-09 MED ORDER — TAMSULOSIN HCL 0.4 MG PO CAPS: 0.4000 mg | ORAL_CAPSULE | Freq: Every day | ORAL | 3 refills | Status: DC

## 2018-04-11 ENCOUNTER — Ambulatory Visit
Admission: RE | Admit: 2018-04-11 | Discharge: 2018-04-11 | Disposition: A | Discharge status: Home / Self Care | Payer: Managed Care, Other (non HMO) | Source: Ambulatory Visit | Attending: Urology | Admitting: Urology

## 2018-04-11 DIAGNOSIS — Z87442 Personal history of urinary calculi: Secondary | ICD-10-CM

## 2018-04-11 DIAGNOSIS — N200 Calculus of kidney: Secondary | ICD-10-CM | POA: Diagnosis not present

## 2018-04-12 ENCOUNTER — Telehealth: Payer: Self-pay

## 2018-04-12 NOTE — Telephone Encounter (Signed)
Called pt informed him of the information below. Pt gave vernal understanding.

## 2018-04-12 NOTE — Telephone Encounter (Signed)
-----   Message from Harle Battiest, PA-C sent at 04/11/2018  3:36 PM EDT ----- Please let Juan Christian know that his renal ultrasound did not reveal any swelling in his kidneys.  He does have a stone in his lower right kidney.  He should have an x-ray prior to his office visit in one year.

## 2018-08-04 IMAGING — CT CT ABD-PELV W/ CM
2 of 5 series · 16 of 46 positions shown, 18 images · IV contrast (APPLIED)
Comparison: MRI  04/13/2016

CLINICAL DATA: Left lower quadrant pain with vomiting

EXAM:
CT ABDOMEN AND PELVIS WITH CONTRAST
TECHNIQUE: Multidetector CT imaging of the abdomen and pelvis was performed
using the standard protocol following bolus administration of
intravenous contrast.
CONTRAST:  75mL Y853MX-DHH IOPAMIDOL (Y853MX-DHH) INJECTION 61%

[Series 2: routine abd/pel with · axial · 0.96mm/px · z∈[-850,-370]mm · 13 of 108 slices shown, 15 images]
[im 6/108  soft-tissue]
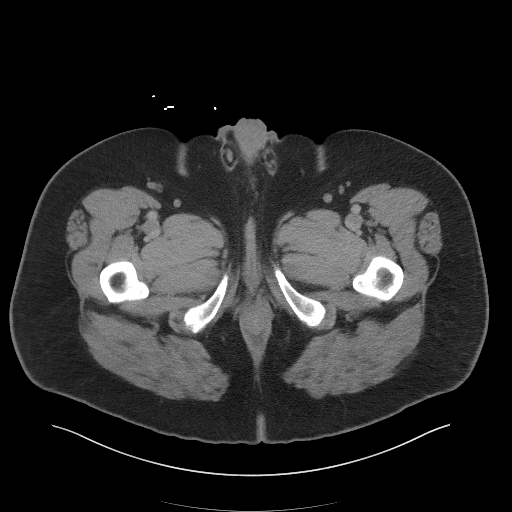
[im 6/108  bone]
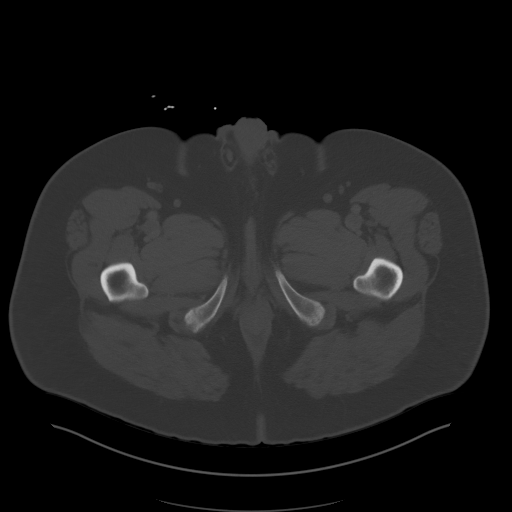
[im 12/108  soft-tissue]
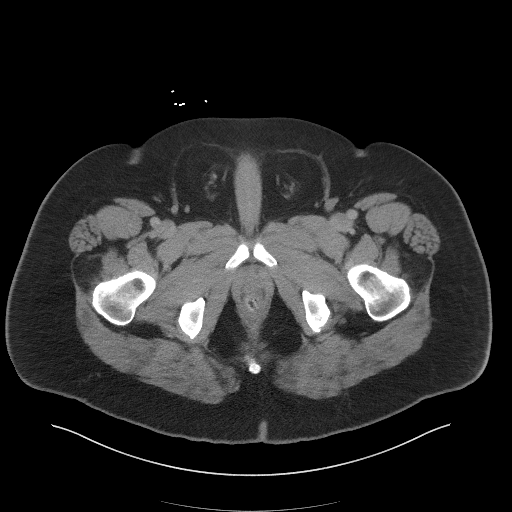
[im 24/108  soft-tissue]
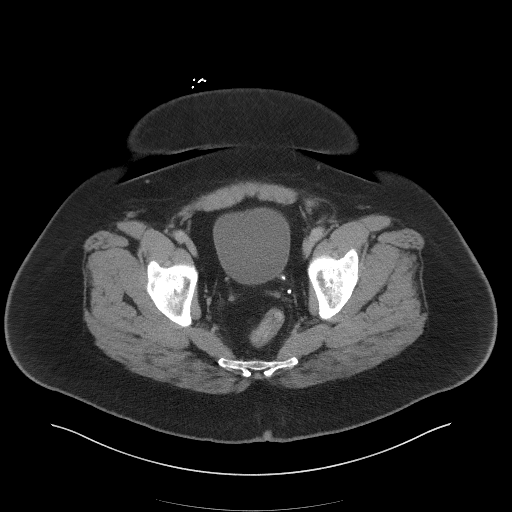
[im 30/108  soft-tissue]
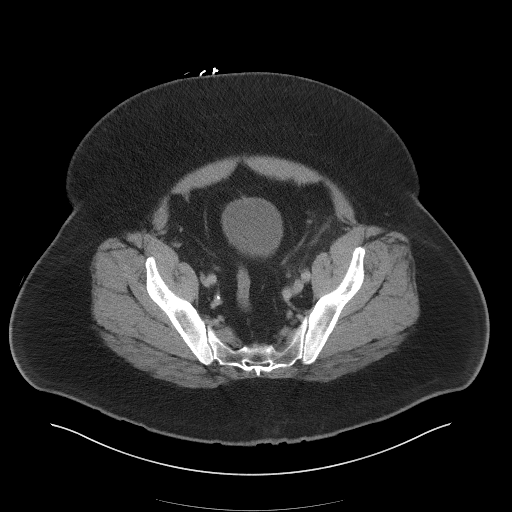
[im 36/108  soft-tissue]
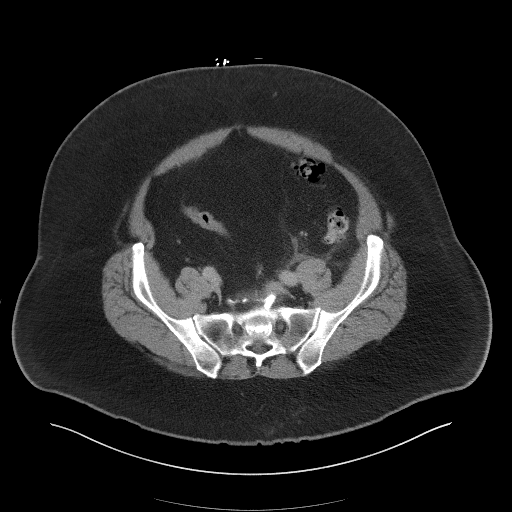
[im 48/108  soft-tissue]
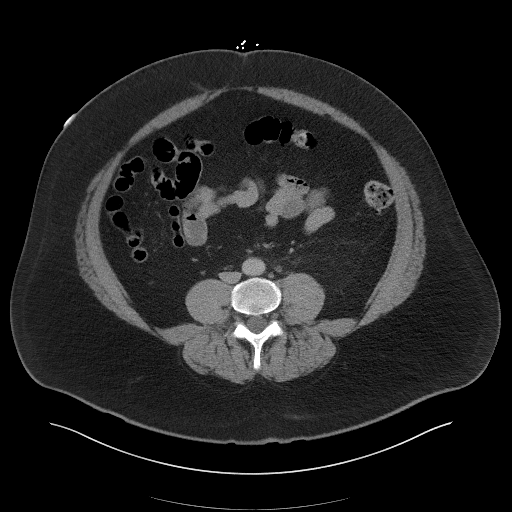
[im 54/108  soft-tissue]
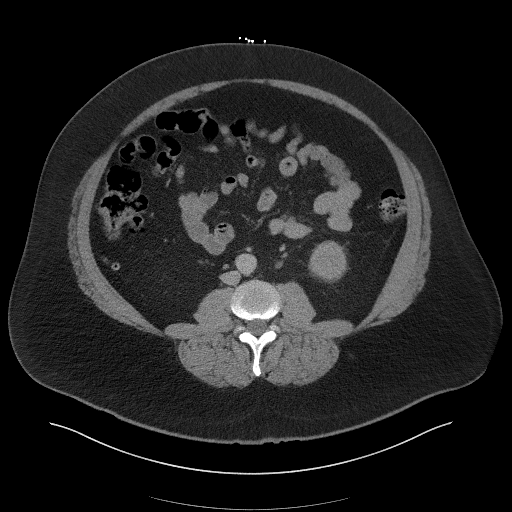
[im 60/108  soft-tissue]
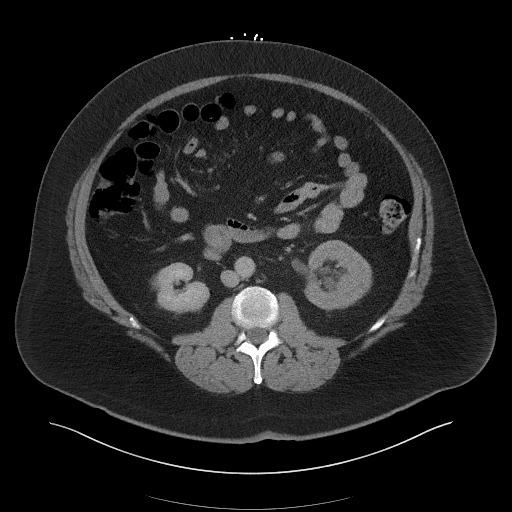
[im 72/108  soft-tissue]
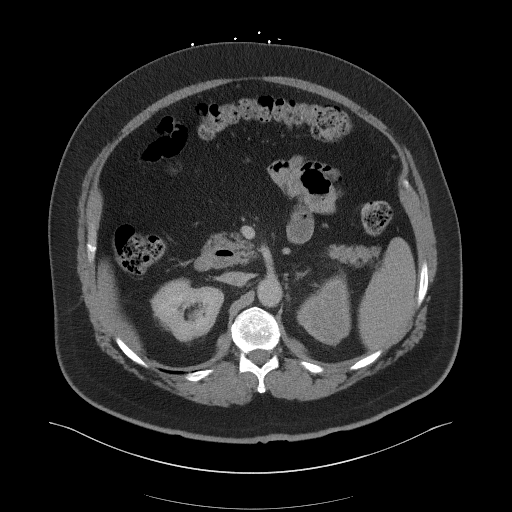
[im 72/108  bone]
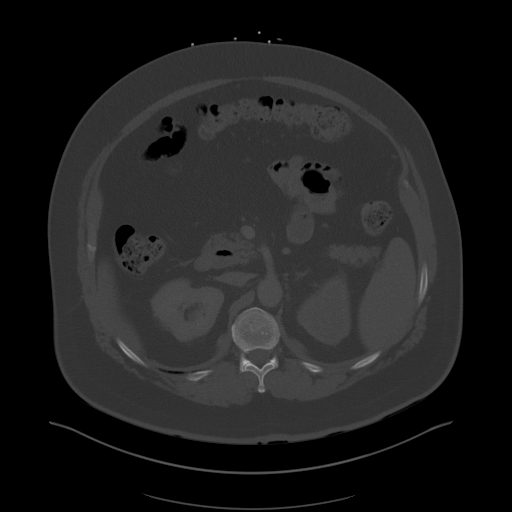
[im 78/108  soft-tissue]
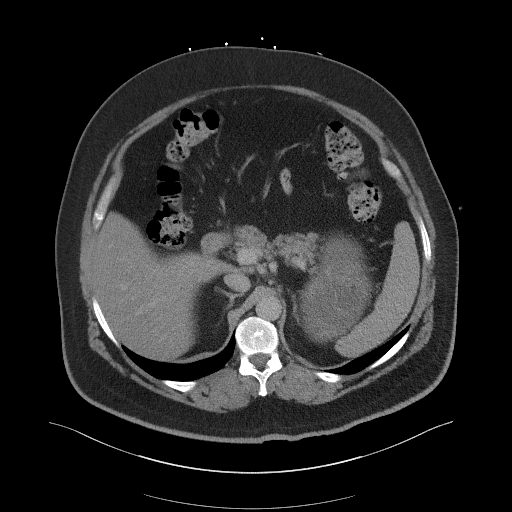
[im 84/108  soft-tissue]
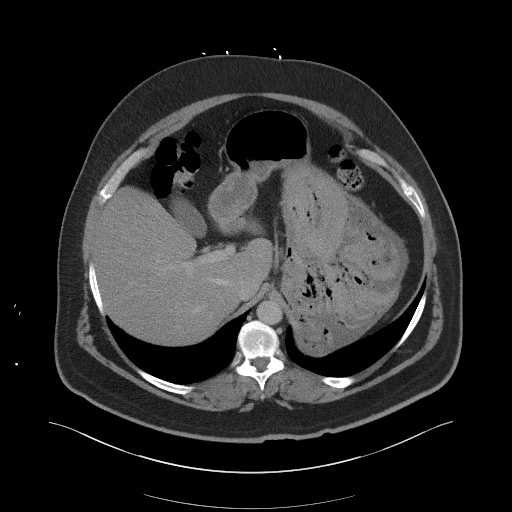
[im 96/108  soft-tissue]
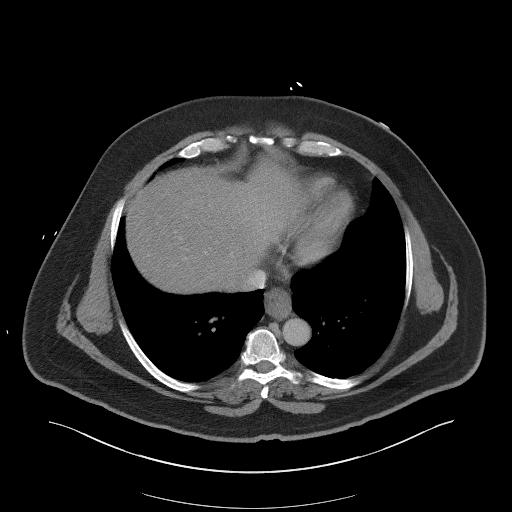
[im 102/108  soft-tissue]
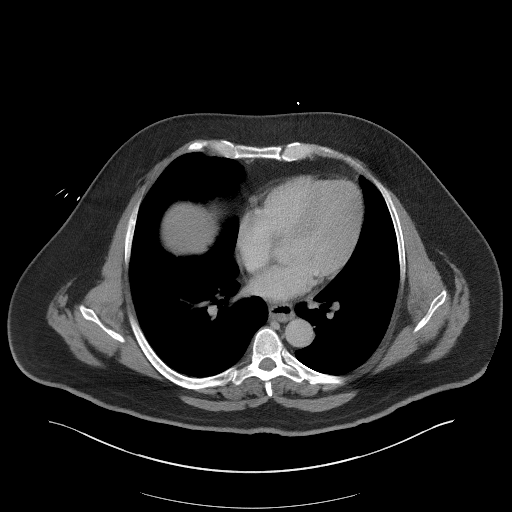

[Series 5: coronal st · coronal · 0.85mm/px · 3 of 122 slices shown]
[im 41/122  soft-tissue]
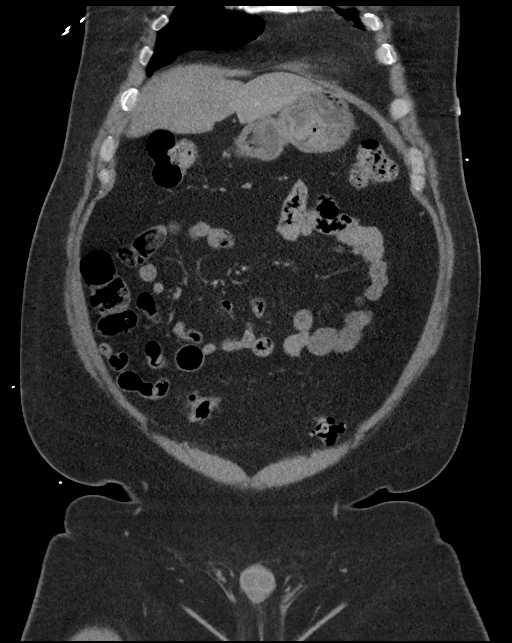
[im 54/122  soft-tissue]
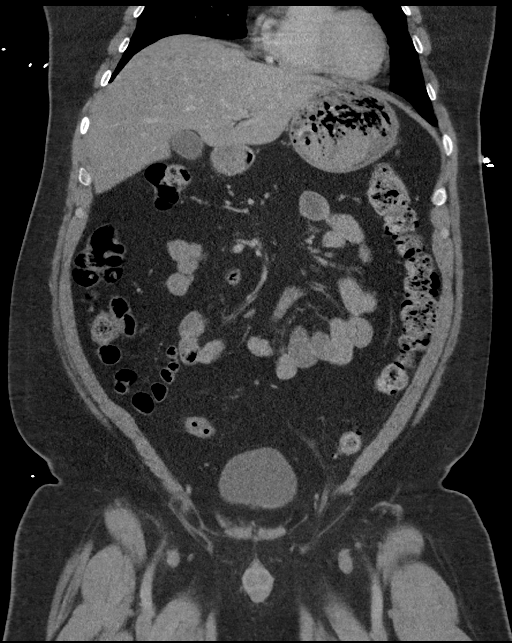
[im 68/122  soft-tissue]
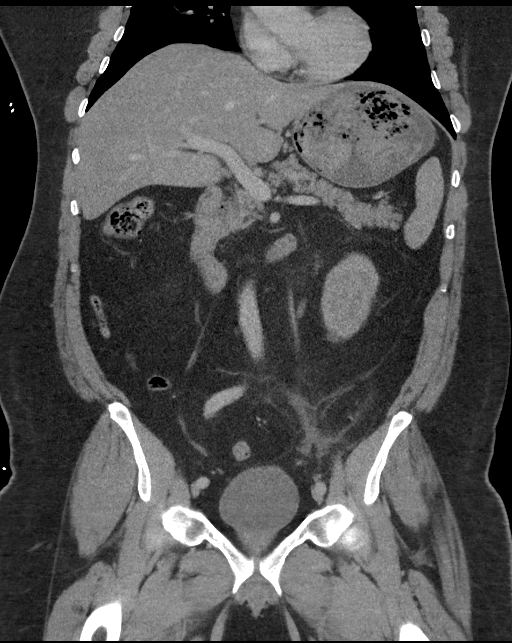

[16 of 46 positions shown; findings below may reference images not displayed]

FINDINGS: Lower chest: Low lung bases demonstrate no acute consolidation or
pleural effusion. Normal heart size. Trace pericardial effusion or
thickening

Hepatobiliary: Hepatic steatosis. No calcified gallstones or biliary
dilatation

Pancreas: Unremarkable. No pancreatic ductal dilatation or
surrounding inflammatory changes.

Spleen: Normal in size without focal abnormality.

Adrenals/Urinary Tract: Adrenal glands are within normal limits.
Delayed nephrogram on the left with poor excretion of contrast on
the delayed views. Mild left hydronephrosis and hydroureter,
secondary to a 4 mm stone within the distal left ureter, just above
the left UVJ. Soft tissue stranding in the left retroperitoneum.
Bladder normal.

Stomach/Bowel: Frothy debris in the stomach. No dilated small bowel.
Normal appendix. Sigmoid colon diverticula without acute
inflammation

Vascular/Lymphatic: No significant vascular findings are present. No
enlarged abdominal or pelvic lymph nodes.

Reproductive: Prostate is unremarkable.

Other: Small fat in the left inguinal canal. Tiny fat in the
umbilicus. No free air or free fluid

Musculoskeletal: Grade 1 anterolisthesis of L5 on S1 with chronic
bilateral pars defect at L5. Chronic compression deformity of L1.
IMPRESSION: 1. Mild left hydronephrosis and hydroureter, secondary to a 4 mm
stone in the distal left ureter just above the left UVJ. Delayed
left nephrogram and poor excretion of contrast, likely due to the
obstruction.
2. Sigmoid colon diverticular disease without acute inflammation
3. Grade 1 anterolisthesis of L5 on S1 with chronic bilateral pars
defect at L5.

## 2019-01-09 ENCOUNTER — Other Ambulatory Visit: Payer: Self-pay | Admitting: Urology

## 2019-03-13 NOTE — Progress Notes (Deleted)
03/14/2019 9:25 AM   Juan Christian 10/27/1960 [id number]  Referring provider: Sofie Hartigan, [place],  [place] [address]  No chief complaint on file.   HPI: Patient is a 58 year old gentleman with a history of nephrolithiasis and BPH with LU TS who presents today for a one year follow up.  History of nephrolithiasis Spontaneously passed a 4 mm left UVJ stone in 01/2017.  Stone composition is unknown.  His father and mother have had kidney stones.  RUS in 04/2018 revealed a nonobstructing 4.6 mm lower pole stone on the right. Normal appearing left kidney. Normal appearing urinary bladder.  KUB today ***.    BPH WITH LUTS  (prostate and/or bladder) I PSS score: ***          PVR: ***          Previous IPSS score: 8/3   PVR: 0 mL     Major complaint(s):  Works the night shift, mild urge incontinence and a weak stream x a while.  Denies any dysuria, hematuria or suprapubic pain.   He had been on the tamsulosin 0.4 mg daily, he has been without it for one month.    Denies any recent fevers, chills, nausea or vomiting.  His father had his prostate removed due to BPH.      Score:  1-7 Mild 8-19 Moderate 20-35 Severe    PMH: Past Medical History:  Diagnosis Date  . GERD (gastroesophageal reflux disease) 03/13/2014  . Gout 03/13/2014  . Hyperlipidemia 03/13/2014    Surgical History: Past Surgical History:  Procedure Laterality Date  . TONSILECTOMY, ADENOIDECTOMY, BILATERAL MYRINGOTOMY AND TUBES      Home Medications:  Allergies as of 03/14/2019   No Known Allergies     Medication List       Accurate as of March 13, 2019  9:25 AM. If you have any questions, ask your nurse or doctor.        simvastatin 10 MG tablet Commonly known as: ZOCOR TAKE 1 TABLET BY MOUTH NIGHTLY   tamsulosin 0.4 MG Caps capsule Commonly known as: FLOMAX TAKE 1 CAPSULE BY MOUTH ONCE DAILY       Allergies: No Known Allergies  Family History: Family History   Problem Relation Age of Onset  . Prostate cancer Neg Hx   . Bladder Cancer Neg Hx   . Kidney cancer Neg Hx     Social History:  reports that he has been smoking. He has never used smokeless tobacco. He reports that he does not drink alcohol or use drugs.  ROS:                                        Physical Exam: There were no vitals taken for this visit.  Constitutional:  Well nourished. Alert and oriented, No acute distress. HEENT:  AT, moist mucus membranes.  Trachea midline, no masses. Cardiovascular: No clubbing, cyanosis, or edema. Respiratory: Normal respiratory effort, no increased work of breathing. GI: Abdomen is soft, non tender, non distended, no abdominal masses. Liver and spleen not palpable.  No hernias appreciated.  Stool sample for occult testing is not indicated.   GU: No CVA tenderness.  No bladder fullness or masses.  Patient with circumcised/uncircumcised phallus. ***Foreskin easily retracted***  Urethral meatus is patent.  No penile discharge. No penile lesions or rashes. Scrotum without lesions, cysts,  rashes and/or edema.  Testicles are located scrotally bilaterally. No masses are appreciated in the testicles. Left and right epididymis are normal. Rectal: Patient with  normal sphincter tone. Anus and perineum without scarring or rashes. No rectal masses are appreciated. Prostate is approximately *** grams, *** nodules are appreciated. Seminal vesicles are normal. Skin: No rashes, bruises or suspicious lesions. Lymph: No cervical or inguinal adenopathy. Neurologic: Grossly intact, no focal deficits, moving all 4 extremities. Psychiatric: Normal mood and affect.  Laboratory Data: PSA   1.74 in 04/14/2017  1.86 in 04/2018  Lab Results  Component Value Date   WBC 17.7 (H) 02/01/2017   HGB 12.4 (L) 02/01/2017   HCT 37.6 (L) 02/01/2017   MCV 84.1 02/01/2017   PLT 257 02/01/2017    Lab Results  Component Value Date   CREATININE 1.83  (H) 02/01/2017    No results found for: PSA  No results found for: TESTOSTERONE  No results found for: HGBA1C  Urinalysis    Component Value Date/Time   COLORURINE STRAW (A) 02/01/2017 2237   APPEARANCEUR Clear 02/09/2017 1131   LABSPEC 1.039 (H) 02/01/2017 2237   PHURINE 5.0 02/01/2017 2237   GLUCOSEU Negative 02/09/2017 1131   HGBUR NEGATIVE 02/01/2017 2237   BILIRUBINUR Negative 02/09/2017 1131   KETONESUR 5 (A) 02/01/2017 2237   PROTEINUR Negative 02/09/2017 1131   PROTEINUR NEGATIVE 02/01/2017 2237   NITRITE Negative 02/09/2017 1131   NITRITE NEGATIVE 02/01/2017 2237   LEUKOCYTESUR Negative 02/09/2017 1131    I have reviewed the labs.  Pertinent Imaging ***   Assessment & Plan:   1. Right nephrolithiasis ***   2. BPH with LUTS IPSS score is 8/3 Continue conservative management, avoiding bladder irritants and timed voiding's Most bothersome symptoms is/are weak stream Continue tamsulosin 0.4 mg daily:refills given RTC in 12 months for I PSS and exam PSA followed by PCP  No follow-ups on file.  Michiel CowboySHANNON Pietrina Jagodzinski, PA-C  [facility] [address] [place], [place] [address] [phone]

## 2019-03-14 ENCOUNTER — Ambulatory Visit: Payer: Managed Care, Other (non HMO) | Admitting: Urology

## 2020-01-09 ENCOUNTER — Other Ambulatory Visit: Payer: Self-pay | Admitting: Urology

## 2020-02-08 ENCOUNTER — Other Ambulatory Visit: Payer: Self-pay | Admitting: Urology

## 2020-02-26 ENCOUNTER — Other Ambulatory Visit: Payer: Self-pay | Admitting: Urology

## 2020-06-16 NOTE — Progress Notes (Signed)
co   06/17/2020 11:31 AM   Juan Christian [birth year] [id number]  Referring provider: Sofie Hartigan, [place],  [place] [address]  Chief Complaint  Patient presents with  . Follow-up    HPI: Juan Christian is a 59 y.o. male with a PMH of nephrolithiasis and BPH with LU TS who presents today for urinary issues.    BPH WITH LUTS  (prostate and/or bladder) IPSS score: 14/3   PVR: 24 mL     Major complaint(s):  Frequency, urgency, incontinence and a weak/split urinary stream x three years. Denies any dysuria, hematuria or suprapubic pain.   Currently taking: tamsulosin, but prescription expired   Denies any recent fevers, chills, nausea or vomiting.  He does not have a family history of PCa.   IPSS    Row Name 06/17/20 1400         International Prostate Symptom Score   How often have you had the sensation of not emptying your bladder? Less than half the time     How often have you had to urinate less than every two hours? Less than 1 in 5 times     How often have you found you stopped and started again several times when you urinated? Less than 1 in 5 times     How often have you found it difficult to postpone urination? About half the time     How often have you had a weak urinary stream? About half the time     How often have you had to strain to start urination? Less than 1 in 5 times     How many times did you typically get up at night to urinate? 3 Times     Total IPSS Score 14       Quality of Life due to urinary symptoms   If you were to spend the rest of your life with your urinary condition just the way it is now how would you feel about that? Mixed            Score:  1-7 Mild 8-19 Moderate 20-35 Severe  PMH: Past Medical History:  Diagnosis Date  . GERD (gastroesophageal reflux disease) 03/13/2014  . Gout 03/13/2014  . Hyperlipidemia 03/13/2014    Surgical History: Past Surgical History:  Procedure Laterality Date  .  TONSILECTOMY, ADENOIDECTOMY, BILATERAL MYRINGOTOMY AND TUBES      Home Medications:  Allergies as of 06/17/2020   No Known Allergies     Medication List       Accurate as of June 17, 2020 11:59 PM. If you have any questions, ask your nurse or doctor.        cefdinir 300 MG capsule Commonly known as: OMNICEF Take by mouth.   simvastatin 10 MG tablet Commonly known as: ZOCOR TAKE 1 TABLET BY MOUTH NIGHTLY   tamsulosin 0.4 MG Caps capsule Commonly known as: FLOMAX Take two tablets daily What changed:   how much to take  how to take this  when to take this  additional instructions Changed by: Kourtnei Rauber, PA-C       Allergies: No Known Allergies  Family History: Family History  Problem Relation Age of Onset  . Prostate cancer Neg Hx   . Bladder Cancer Neg Hx   . Kidney cancer Neg Hx     Social History:  reports that he has been smoking. He has never used smokeless tobacco. He reports that he does not drink  alcohol and does not use drugs.  ROS: Pertinent ROS in HPI  Physical Exam: BP (!) 156/103 (BP Location: Left Arm, Patient Position: Sitting, Cuff Size: Large)   Pulse (!) 106   Ht $R'5\' 6"'Lt$  (1.676 m)   Wt 276 lb (125.2 kg)   BMI 44.55 kg/m   Constitutional:  Well nourished. Alert and oriented, No acute distress. HEENT: [place] AT, mask in place.  Trachea midline Cardiovascular: No clubbing, cyanosis, or edema. Respiratory: Normal respiratory effort, no increased work of breathing. GU: No CVA tenderness.  No bladder fullness or masses.  Patient with uncircumcised phallus. Foreskin easily retracted  Urethral meatus is patent.  No penile discharge. No penile lesions or rashes. Scrotum without lesions, cysts, rashes and/or edema.  Testicles are located scrotally bilaterally. No masses are appreciated in the testicles. Left and right epididymis are normal. Rectal: Patient with  normal sphincter tone. Anus and perineum without scarring or rashes. No rectal  masses are appreciated. Prostate is approximately 50 grams, could only palpate the apex and the midportion of the gland, no  nodules are appreciated. Seminal vesicles could not be palpated Neurologic: Grossly intact, no focal deficits, moving all 4 extremities. Psychiatric: Normal mood and affect.  Laboratory Data: Related to PSA, Total (Screen) Component 02/20/20 05/09/19 04/19/18 04/14/17  PSA (Prostate Specific Antigen), Total 2.03 1.84 1.86 1.74   Specimen:  Blood  Ref Range & Units 3 mo ago  Glucose 70 - 110 mg/dL 90      Sodium 136 - 145 mmol/L 140      Potassium 3.6 - 5.1 mmol/L 4.6      Chloride 97 - 109 mmol/L 103      Carbon Dioxide (CO2) 22.0 - 32.0 mmol/L 29.2      Urea Nitrogen (BUN) 7 - 25 mg/dL 15      Creatinine 0.7 - 1.3 mg/dL 0.9      Glomerular Filtration Rate (eGFR), MDRD Estimate >60 mL/min/1.73sq m 87      Calcium 8.7 - 10.3 mg/dL 9.5      AST  8 - 39 U/L 24      ALT  6 - 57 U/L 31      Alk Phos (alkaline Phosphatase) 34 - 104 U/L 88      Albumin 3.5 - 4.8 g/dL 4.3      Bilirubin, Total 0.3 - 1.2 mg/dL 0.5      Protein, Total 6.1 - 7.9 g/dL 7.1      A/G Ratio 1.0 - 5.0 gm/dL 1.5      Resulting Agency  [facility] - [facility]  Specimen Collected: 02/20/20 2:08 PM Last Resulted: 02/21/20 11:59 AM  Received From: [facility]  Result Received: 06/16/20 2:14 PM   Specimen:  Blood  Ref Range & Units 3 mo ago  Cholesterol, Total 100 - 200 mg/dL 179      Triglyceride 35 - 199 mg/dL 199      HDL (High Density Lipoprotein) Cholesterol 29.0 - 71.0 mg/dL 44.8      LDL Calculated 0 - 130 mg/dL 94      VLDL Cholesterol mg/dL 40      Cholesterol/HDL Ratio  4.0      Resulting Agency  [facility] - [facility]  Specimen Collected: 02/20/20 2:08 PM Last Resulted: 02/21/20 11:59 AM  Received From: [facility]  Result Received: 06/16/20 2:14 PM     Specimen:  Blood  Ref Range & Units 3 mo ago  WBC  (  White Blood Cell Count) 4.1 - 10.2 10^3/uL 8.0      RBC (Red Blood Cell Count) 4.69 - 6.13 10^6/uL 5.77      Hemoglobin 14.1 - 18.1 gm/dL 16.0      Hematocrit 40.0 - 52.0 % 50.7      MCV (Mean Corpuscular Volume) 80.0 - 100.0 fl 87.9      MCH (Mean Corpuscular Hemoglobin) 27.0 - 31.2 pg 27.7      MCHC (Mean Corpuscular Hemoglobin Concentration) 32.0 - 36.0 gm/dL 31.6Low      Platelet Count 150 - 450 10^3/uL 169      RDW-CV (Red Cell Distribution Width) 11.6 - 14.8 % 13.3      MPV (Mean Platelet Volume) 9.4 - 12.4 fl 10.5      Neutrophils 1.50 - 7.80 10^3/uL 5.20      Lymphocytes 1.00 - 3.60 10^3/uL 2.20      Mixed Count 0.10 - 0.90 10^3/uL 0.60      Neutrophil % 32.0 - 70.0 % 65.3      Lymphocyte % 10.0 - 50.0 % 27.2      Mixed % 3.0 - 14.4 % 7.5      Resulting Agency  [facility] - [facility]  Specimen Collected: 02/20/20 2:08 PM Last Resulted: 02/20/20 2:32 PM  Received From: [facility]  Result Received: 06/16/20 2:14 PM   Specimen:  Blood  Ref Range & Units 3 mo ago  Thyroid Stimulating Hormone (TSH) 0.450-5.330 uIU/ml uIU/mL 3.031      Resulting Agency  [facility] - [facility]  Specimen Collected: 02/20/20 2:08 PM Last Resulted: 02/21/20 11:58 AM  Received From: [facility]  Result Received: 06/16/20 2:14 PM    Urinalysis Component     Latest Ref Rng & Units 06/17/2020  Specific Gravity, UA     1.005 - 1.030 1.020  pH, UA     5.0 - 7.5 5.0  Color, UA     Yellow Yellow  Appearance Ur     Clear Clear  Leukocytes,UA     Negative Negative  Protein,UA     Negative/Trace Negative  Glucose, UA     Negative Negative  Ketones, UA     Negative Negative  RBC, UA     Negative Negative  Bilirubin, UA     Negative Negative  Urobilinogen, Ur     0.2 - 1.0 mg/dL 0.2  Nitrite, UA     Negative Negative  Microscopic Examination      See below:   Component     Latest Ref Rng & Units 06/17/2020  WBC, UA      0 - 5 /hpf 0-5  RBC     0 - 2 /hpf None seen  Epithelial Cells (non renal)     0 - 10 /hpf 0-10  Bacteria, UA     None seen/Few None seen    I have reviewed the labs.   Pertinent Imaging: Results for DARTANIAN, KNAGGS (MRN [id number]) as of 07/08/2020 11:28  Ref. Range 06/17/2020 14:38  Scan Result Unknown 66mL   Assessment & Plan:    1. BPH with LU TS IPSS score is 14/3 Continue conservative management, avoiding bladder irritants and timed voiding's Most bothersome symptoms is/are frequency, urgency, incontinence and a weak/split urinary stream  Continue tamsulosin 0.4 mg daily - refills given RTC in 1 month for I PSS and PVR  Return in about 1 month (around 07/17/2020) for IPSS and PVR.  These notes generated with voice recognition software. I apologize for typographical errors.  Juan Council, PA-C  [facility] [address]  [place], [place] [address] [phone]

## 2020-06-17 ENCOUNTER — Ambulatory Visit (INDEPENDENT_AMBULATORY_CARE_PROVIDER_SITE_OTHER): Payer: Managed Care, Other (non HMO) | Admitting: Urology

## 2020-06-17 ENCOUNTER — Encounter: Payer: Self-pay | Admitting: Urology

## 2020-06-17 ENCOUNTER — Other Ambulatory Visit: Payer: Self-pay

## 2020-06-17 VITALS — BP 156/103 | HR 106 | Ht 66.0 in | Wt 276.0 lb

## 2020-06-17 DIAGNOSIS — N401 Enlarged prostate with lower urinary tract symptoms: Secondary | ICD-10-CM

## 2020-06-17 DIAGNOSIS — N138 Other obstructive and reflux uropathy: Secondary | ICD-10-CM | POA: Diagnosis not present

## 2020-06-17 LAB — BLADDER SCAN AMB NON-IMAGING

## 2020-06-17 MED ORDER — TAMSULOSIN HCL 0.4 MG PO CAPS: ORAL_CAPSULE | ORAL | 3 refills | Status: DC

## 2020-06-18 LAB — URINALYSIS, COMPLETE
Bilirubin, UA: NEGATIVE
Glucose, UA: NEGATIVE
Ketones, UA: NEGATIVE
Leukocytes,UA: NEGATIVE
Nitrite, UA: NEGATIVE
Protein,UA: NEGATIVE
RBC, UA: NEGATIVE
Specific Gravity, UA: 1.02 (ref 1.005–1.030)
Urobilinogen, Ur: 0.2 mg/dL (ref 0.2–1.0)
pH, UA: 5 (ref 5.0–7.5)

## 2020-06-18 LAB — MICROSCOPIC EXAMINATION
Bacteria, UA: NONE SEEN
RBC: NONE SEEN /hpf (ref 0–2)

## 2020-07-15 NOTE — Progress Notes (Signed)
co   07/16/2020 3:55 PM   DEMARQUS Christian 03/28/1961 [id number]  Referring provider: Sofie Hartigan, MD [place],  [place] [address]  Chief Complaint  Patient presents with  . Benign Prostatic Hypertrophy    HPI: Juan Christian is a 59 y.o. male with a PMH of nephrolithiasis and BPH with LU TS who presents today for urinary issues.    BPH WITH LUTS  (prostate and/or bladder) I PSS: 3/1     PVR: 84 mL      Previous IPSS score: 14/3   Previous PVR: 24 mL     Major complaint(s):  Frequency, urgency, incontinence and a weak/split urinary stream x three years.   Patient denies any modifying or aggravating factors.  Patient denies any gross hematuria, dysuria or suprapubic/flank pain.  Patient denies any fevers, chills, nausea or vomiting.   Currently taking: tamsulosin 0.4 mg daily   He does not have a family history of PCa.   IPSS    Row Name 07/16/20 1500         International Prostate Symptom Score   How often have you had the sensation of not emptying your bladder? Not at All     How often have you had to urinate less than every two hours? Not at All     How often have you found you stopped and started again several times when you urinated? Not at All     How often have you found it difficult to postpone urination? Less than 1 in 5 times     How often have you had a weak urinary stream? Not at All     How often have you had to strain to start urination? Not at All     How many times did you typically get up at night to urinate? 2 Times     Total IPSS Score 3           Quality of Life due to urinary symptoms   If you were to spend the rest of your life with your urinary condition just the way it is now how would you feel about that? Pleased            Score:  1-7 Mild 8-19 Moderate 20-35 Severe  PMH: Past Medical History:  Diagnosis Date  . GERD (gastroesophageal reflux disease) 03/13/2014  . Gout 03/13/2014  . Hyperlipidemia 03/13/2014     Surgical History: Past Surgical History:  Procedure Laterality Date  . TONSILECTOMY, ADENOIDECTOMY, BILATERAL MYRINGOTOMY AND TUBES      Home Medications:  Allergies as of 07/16/2020   No Known Allergies     Medication List       Accurate as of July 16, 2020  3:55 PM. If you have any questions, ask your nurse or doctor.        STOP taking these medications   cefdinir 300 MG capsule Commonly known as: OMNICEF Stopped by: Zara Council, PA-C     TAKE these medications   simvastatin 10 MG tablet Commonly known as: ZOCOR TAKE 1 TABLET BY MOUTH NIGHTLY   tamsulosin 0.4 MG Caps capsule Commonly known as: FLOMAX Take two tablets daily       Allergies: No Known Allergies  Family History: Family History  Problem Relation Age of Onset  . Prostate cancer Neg Hx   . Bladder Cancer Neg Hx   . Kidney cancer Neg Hx     Social History:  reports that he has  been smoking. He has never used smokeless tobacco. He reports that he does not drink alcohol and does not use drugs.  ROS: Pertinent ROS in HPI  Physical Exam: BP (!) 152/110   Pulse 91   Ht 5' 6"  (1.676 m)   Wt 277 lb (125.6 kg)   BMI 44.71 kg/m   Constitutional:  Well nourished. Alert and oriented, No acute distress. HEENT: [place] AT, mask in place.  Trachea midline Cardiovascular: No clubbing, cyanosis, or edema. Respiratory: Normal respiratory effort, no increased work of breathing. Neurologic: Grossly intact, no focal deficits, moving all 4 extremities. Psychiatric: Normal mood and affect.  Laboratory Data: Related to PSA, Total (Screen) Component 02/20/20 05/09/19 04/19/18 04/14/17  PSA (Prostate Specific Antigen), Total 2.03 1.84 1.86 1.74   Specimen:  Blood  Ref Range & Units 3 mo ago  Glucose 70 - 110 mg/dL 90      Sodium 136 - 145 mmol/L 140      Potassium 3.6 - 5.1 mmol/L 4.6      Chloride 97 - 109 mmol/L 103      Carbon Dioxide (CO2) 22.0 - 32.0 mmol/L 29.2      Urea Nitrogen (BUN)  7 - 25 mg/dL 15      Creatinine 0.7 - 1.3 mg/dL 0.9      Glomerular Filtration Rate (eGFR), MDRD Estimate >60 mL/min/1.73sq m 87      Calcium 8.7 - 10.3 mg/dL 9.5      AST  8 - 39 U/L 24      ALT  6 - 57 U/L 31      Alk Phos (alkaline Phosphatase) 34 - 104 U/L 88      Albumin 3.5 - 4.8 g/dL 4.3      Bilirubin, Total 0.3 - 1.2 mg/dL 0.5      Protein, Total 6.1 - 7.9 g/dL 7.1      A/G Ratio 1.0 - 5.0 gm/dL 1.5      Resulting Agency  [facility]  Specimen Collected: 02/20/20 2:08 PM Last Resulted: 02/21/20 11:59 AM  Received From: [facility]  Result Received: 06/16/20 2:14 PM   Specimen:  Blood  Ref Range & Units 3 mo ago  Cholesterol, Total 100 - 200 mg/dL 179      Triglyceride 35 - 199 mg/dL 199      HDL (High Density Lipoprotein) Cholesterol 29.0 - 71.0 mg/dL 44.8      LDL Calculated 0 - 130 mg/dL 94      VLDL Cholesterol mg/dL 40      Cholesterol/HDL Ratio  4.0      Resulting [facility] - [facility]  Specimen Collected: 02/20/20 2:08 PM Last Resulted: 02/21/20 11:59 AM  Received From: [facility]  Result Received: 06/16/20 2:14 PM     Specimen:  Blood  Ref Range & Units 3 mo ago  WBC (White Blood Cell Count) 4.1 - 10.2 10^3/uL 8.0      RBC (Red Blood Cell Count) 4.69 - 6.13 10^6/uL 5.77      Hemoglobin 14.1 - 18.1 gm/dL 16.0      Hematocrit 40.0 - 52.0 % 50.7      MCV (Mean Corpuscular Volume) 80.0 - 100.0 fl 87.9      MCH (Mean Corpuscular Hemoglobin) 27.0 - 31.2 pg 27.7      MCHC (Mean Corpuscular Hemoglobin Concentration) 32.0 - 36.0 gm/dL 31.6Low      Platelet Count 150 - 450 10^3/uL  169      RDW-CV (Red Cell Distribution Width) 11.6 - 14.8 % 13.3      MPV (Mean Platelet Volume) 9.4 - 12.4 fl 10.5      Neutrophils 1.50 - 7.80 10^3/uL 5.20      Lymphocytes 1.00 - 3.60 10^3/uL 2.20      Mixed Count 0.10 - 0.90 10^3/uL 0.60      Neutrophil % 32.0 - 70.0 % 65.3       Lymphocyte % 10.0 - 50.0 % 27.2      Mixed % 3.0 - 14.4 % 7.5      Resulting Agency  [facility]  Specimen Collected: 02/20/20 2:08 PM Last Resulted: 02/20/20 2:32 PM  Received From: [facility]  Result Received: 06/16/20 2:14 PM   Specimen:  Blood  Ref Range & Units 3 mo ago  Thyroid Stimulating Hormone (TSH) 0.450-5.330 uIU/ml uIU/mL 3.031      Resulting [facility] - [facility]  Specimen Collected: 02/20/20 2:08 PM Last Resulted: 02/21/20 11:58 AM  Received From: [facility]  Result Received: 06/16/20 2:14 PM    Urinalysis Component     Latest Ref Rng & Units 06/17/2020  Specific Gravity, UA     1.005 - 1.030 1.020  pH, UA     5.0 - 7.5 5.0  Color, UA     Yellow Yellow  Appearance Ur     Clear Clear  Leukocytes,UA     Negative Negative  Protein,UA     Negative/Trace Negative  Glucose, UA     Negative Negative  Ketones, UA     Negative Negative  RBC, UA     Negative Negative  Bilirubin, UA     Negative Negative  Urobilinogen, Ur     0.2 - 1.0 mg/dL 0.2  Nitrite, UA     Negative Negative  Microscopic Examination      See below:   Component     Latest Ref Rng & Units 06/17/2020  WBC, UA     0 - 5 /hpf 0-5  RBC     0 - 2 /hpf None seen  Epithelial Cells (non renal)     0 - 10 /hpf 0-10  Bacteria, UA     None seen/Few None seen    I have reviewed the labs.   Pertinent Imaging: Results for MERLIN, GOLDEN (MRN [id number]) as of 07/16/2020 15:46  Ref. Range 07/16/2020 15:43  Scan Result Unknown 84 ml    Assessment & Plan:    1. BPH with LU TS IPSS score is 3/1, it is improved Continue conservative management, avoiding bladder irritants and timed voiding's Most bothersome symptoms is/are frequency, urgency, incontinence and a weak/split urinary stream  Continue tamsulosin 0.4 mg daily - refills given RTC in 1 month for I PSS and PVR  Return in about 1 year (around 07/16/2021) for  PSA, IPSS, PVR and exam.  These notes generated with voice recognition software. I apologize for typographical errors.  Zara Council, PA-C  [facility] [address]  [place], [place] [address] [phone]

## 2020-07-16 ENCOUNTER — Ambulatory Visit (INDEPENDENT_AMBULATORY_CARE_PROVIDER_SITE_OTHER): Payer: Managed Care, Other (non HMO) | Admitting: Urology

## 2020-07-16 ENCOUNTER — Other Ambulatory Visit: Payer: Self-pay

## 2020-07-16 ENCOUNTER — Encounter: Payer: Self-pay | Admitting: Urology

## 2020-07-16 VITALS — BP 152/110 | HR 91 | Ht 66.0 in | Wt 277.0 lb

## 2020-07-16 DIAGNOSIS — N138 Other obstructive and reflux uropathy: Secondary | ICD-10-CM | POA: Diagnosis not present

## 2020-07-16 DIAGNOSIS — N401 Enlarged prostate with lower urinary tract symptoms: Secondary | ICD-10-CM | POA: Diagnosis not present

## 2020-07-16 LAB — BLADDER SCAN AMB NON-IMAGING: Scan Result: 84

## 2020-10-23 ENCOUNTER — Other Ambulatory Visit (HOSPITAL_COMMUNITY): Payer: Self-pay | Admitting: Podiatry

## 2020-10-23 ENCOUNTER — Other Ambulatory Visit: Payer: Self-pay | Admitting: Podiatry

## 2020-10-23 DIAGNOSIS — M79672 Pain in left foot: Secondary | ICD-10-CM

## 2020-11-06 ENCOUNTER — Ambulatory Visit
Admission: RE | Admit: 2020-11-06 | Discharge: 2020-11-06 | Disposition: A | Discharge status: Home / Self Care | Payer: Managed Care, Other (non HMO) | Source: Ambulatory Visit | Attending: Podiatry | Admitting: Podiatry

## 2020-11-06 ENCOUNTER — Other Ambulatory Visit: Payer: Self-pay

## 2020-11-06 DIAGNOSIS — M79672 Pain in left foot: Secondary | ICD-10-CM | POA: Insufficient documentation

## 2021-01-29 ENCOUNTER — Other Ambulatory Visit: Payer: Self-pay | Admitting: Urology

## 2021-01-29 DIAGNOSIS — N138 Other obstructive and reflux uropathy: Secondary | ICD-10-CM

## 2021-05-07 ENCOUNTER — Other Ambulatory Visit: Payer: Self-pay | Admitting: Urology

## 2021-05-07 DIAGNOSIS — N138 Other obstructive and reflux uropathy: Secondary | ICD-10-CM

## 2021-07-16 ENCOUNTER — Ambulatory Visit: Payer: Self-pay | Admitting: Urology

## 2021-08-12 ENCOUNTER — Other Ambulatory Visit: Payer: Self-pay | Admitting: Urology

## 2021-08-12 DIAGNOSIS — N401 Enlarged prostate with lower urinary tract symptoms: Secondary | ICD-10-CM

## 2021-08-12 DIAGNOSIS — N138 Other obstructive and reflux uropathy: Secondary | ICD-10-CM

## 2021-08-17 NOTE — Progress Notes (Signed)
co   08/18/2021 3:53 PM   Juan Christian 1961/04/08 [id number]  Referring provider: Sofie Hartigan, MD [place],  [place] [address]  Chief Complaint  Patient presents with   Benign Prostatic Hypertrophy   Urological history 1. BPH with LU TS -PSA 2.51 in 02/2021 -I PSS 9/3 -Managed with tamsulosin 0.4 mg twice daily  2.  Nephrolithiasis -Spontaneous passage of the left UVJ stone in 2018  HPI: Juan Christian is a 61 y.o. male who presents today for yearly visit.  No urinary complaints at this visit.  He continues to have some episodes of urge incontinence, but it is very rare.  Patient denies any modifying or aggravating factors.  Patient denies any gross hematuria, dysuria or suprapubic/flank pain.  Patient denies any fevers, chills, nausea or vomiting.     IPSS     Row Name 08/18/21 1500         International Prostate Symptom Score   How often have you had the sensation of not emptying your bladder? Not at All     How often have you had to urinate less than every two hours? Less than half the time     How often have you found you stopped and started again several times when you urinated? Not at All     How often have you found it difficult to postpone urination? About half the time     How often have you had a weak urinary stream? Less than half the time     How often have you had to strain to start urination? Not at All     How many times did you typically get up at night to urinate? 2 Times     Total IPSS Score 9       Quality of Life due to urinary symptoms   If you were to spend the rest of your life with your urinary condition just the way it is now how would you feel about that? Mixed               Score:  1-7 Mild 8-19 Moderate 20-35 Severe  Patient still having spontaneous erections.  He denies any pain or curvature with erections.     SHIM     Row Name 08/18/21 1535         SHIM: Over the last 6 months:   How do you rate  your confidence that you could get and keep an erection? High     When you had erections with sexual stimulation, how often were your erections hard enough for penetration (entering your partner)? Almost Always or Always     During sexual intercourse, how often were you able to maintain your erection after you had penetrated (entered) your partner? Almost Always or Always     During sexual intercourse, how difficult was it to maintain your erection to completion of intercourse? Not Difficult     When you attempted sexual intercourse, how often was it satisfactory for you? Most Times (much more than half the time)       SHIM Total Score   SHIM 23              Score: 1-7 Severe ED 8-11 Moderate ED 12-16 Mild-Moderate ED 17-21 Mild ED 22-25 No ED    PMH: Past Medical History:  Diagnosis Date   GERD (gastroesophageal reflux disease) 03/13/2014   Gout 03/13/2014   Hyperlipidemia 03/13/2014    Surgical History:  Past Surgical History:  Procedure Laterality Date   TONSILECTOMY, ADENOIDECTOMY, BILATERAL MYRINGOTOMY AND TUBES      Home Medications:  Allergies as of 08/18/2021   No Known Allergies      Medication List        Accurate as of August 18, 2021  3:53 PM. If you have any questions, ask your nurse or doctor.          simvastatin 10 MG tablet Commonly known as: ZOCOR TAKE 1 TABLET BY MOUTH NIGHTLY   tamsulosin 0.4 MG Caps capsule Commonly known as: FLOMAX TAKE (2) CAPSULES BY MOUTH ONCE DAILY.        Allergies: No Known Allergies  Family History: Family History  Problem Relation Age of Onset   Prostate cancer Neg Hx    Bladder Cancer Neg Hx    Kidney cancer Neg Hx     Social History:  reports that he has been smoking. He has never used smokeless tobacco. He reports that he does not drink alcohol and does not use drugs.  ROS: Pertinent ROS in HPI  Physical Exam: BP 136/87    Pulse (!) 53    Ht 5' 6"  (1.676 m)    Wt 279 lb (126.6 kg)    BMI 45.03  kg/m   Constitutional:  Well nourished. Alert and oriented, No acute distress. HEENT: [place] AT, mask in place.  Trachea midline Cardiovascular: No clubbing, cyanosis, or edema. Respiratory: Normal respiratory effort, no increased work of breathing. GU: No CVA tenderness.  No bladder fullness or masses.  Patient with uncircumcised phallus. Foreskin easily retracted  Urethral meatus is patent.  No penile discharge. No penile lesions or rashes. Scrotum without lesions, cysts, rashes and/or edema.  Testicles are located scrotally bilaterally. No masses are appreciated in the testicles. Left and right epididymis are normal. Rectal: Patient with  normal sphincter tone. Anus and perineum without scarring or rashes. No rectal masses are appreciated. Prostate is approximately 50 grams, could only palpate apex, no nodules are appreciated. Seminal vesicles could not be palpated.  Neurologic: Grossly intact, no focal deficits, moving all 4 extremities. Psychiatric: Normal mood and affect.   Laboratory Data: Related to PSA, Total (Screen) Component 02/25/21  02/20/20  05/09/19  04/19/18  04/14/17   PSA (Prostate Specific Antigen), Total 2.51 2.03 1.84 1.86 1.74   Glucose 70 - 110 mg/dL 91   Sodium 136 - 145 mmol/L 139   Potassium 3.6 - 5.1 mmol/L 4.6   Chloride 97 - 109 mmol/L 103   Carbon Dioxide (CO2) 22.0 - 32.0 mmol/L 27.9   Urea Nitrogen (BUN) 7 - 25 mg/dL 11   Creatinine 0.7 - 1.3 mg/dL 1.1   Glomerular Filtration Rate (eGFR), MDRD Estimate >60 mL/min/1.73sq m 69   Calcium 8.7 - 10.3 mg/dL 9.5   AST  8 - 39 U/L 23   ALT  6 - 57 U/L 31   Alk Phos (alkaline Phosphatase) 34 - 104 U/L 89   Albumin 3.5 - 4.8 g/dL 4.3   Bilirubin, Total 0.3 - 1.2 mg/dL 0.4   Protein, Total 6.1 - 7.9 g/dL 7.1   A/G Ratio 1.0 - 5.0 gm/dL 1.5   Resulting Agency  [facility] - [facility]  Specimen Collected: 02/25/21 14:55 Last Resulted: 02/26/21 14:06  Received From: [facility]  Result Received:  05/07/21 13:42   Cholesterol, Total 100 - 200 mg/dL 181   Triglyceride 35 - 199 mg/dL 153   HDL (High Density Lipoprotein) Cholesterol 29.0 -  71.0 mg/dL 46.1   LDL Calculated 0 - 130 mg/dL 104   VLDL Cholesterol mg/dL 31   Cholesterol/HDL Ratio  3.9   Resulting Agency  [facility] - [facility]  Specimen Collected: 02/25/21 14:55 Last Resulted: 02/26/21 14:06  Received From: [facility]  Result Received: 05/07/21 13:42  I have reviewed the labs.     Pertinent Imaging: N/A   Assessment & Plan:    1. BPH with LUTS -PSA stable -DRE benign --most bothersome symptoms are mild urge incontinence -continue conservative management, avoiding bladder irritants and timed voiding's -Continue tamsulosin 0.4 mg daily   Return in about 1 year (around 08/18/2022) for IPSS, SHIM and exam.  These notes generated with voice recognition software. I apologize for typographical errors.  Zara Council, PA-C  [facility] [address]  [place], [place] [address] [phone]

## 2021-08-18 ENCOUNTER — Other Ambulatory Visit: Payer: Self-pay

## 2021-08-18 ENCOUNTER — Ambulatory Visit (INDEPENDENT_AMBULATORY_CARE_PROVIDER_SITE_OTHER): Payer: Managed Care, Other (non HMO) | Admitting: Urology

## 2021-08-18 ENCOUNTER — Encounter: Payer: Self-pay | Admitting: Urology

## 2021-08-18 VITALS — BP 136/87 | HR 53 | Ht 66.0 in | Wt 279.0 lb

## 2021-08-18 DIAGNOSIS — N138 Other obstructive and reflux uropathy: Secondary | ICD-10-CM

## 2021-08-18 DIAGNOSIS — N401 Enlarged prostate with lower urinary tract symptoms: Secondary | ICD-10-CM

## 2021-09-24 ENCOUNTER — Other Ambulatory Visit: Payer: Self-pay | Admitting: *Deleted

## 2021-09-24 DIAGNOSIS — N138 Other obstructive and reflux uropathy: Secondary | ICD-10-CM

## 2021-09-24 MED ORDER — TAMSULOSIN HCL 0.4 MG PO CAPS
0.4000 mg | ORAL_CAPSULE | Freq: Every day | ORAL | 3 refills | Status: DC
Start: 2021-09-24 — End: 2022-08-18

## 2022-08-17 NOTE — Progress Notes (Signed)
co   08/18/2022 11:47 PM   ZAMIER EGGEBRECHT Jan 16, 1961 [id number]  Referring provider: Sofie Hartigan, MD [place],  [place] [address]  Urological history 1. BPH with LU TS -PSA (06/2022) 2.65 -I PSS 14/4 -tamsulosin 0.4 mg twice daily  2.  Nephrolithiasis -Spontaneous passage of the left UVJ stone in 2018  3. ED -SHIM 24  HPI: CHONG WOJDYLA is a 62 y.o. male who presents today for yearly visit.  He his having some urgency and nocturia.   His work shift consists of both night and daytime hours.  He also drinks a lot of fluids during his shifts to keep awake.  He finds that when he take the tamsulosin, he has control of his nocturia.  Patient denies any modifying or aggravating factors.  Patient denies any gross hematuria, dysuria or suprapubic/flank pain.  Patient denies any fevers, chills, nausea or vomiting.      IPSS     Row Name 08/18/22 1500         International Prostate Symptom Score   How often have you had the sensation of not emptying your bladder? Less than 1 in 5     How often have you had to urinate less than every two hours? About half the time     How often have you found you stopped and started again several times when you urinated? Less than 1 in 5 times     How often have you found it difficult to postpone urination? Less than half the time     How often have you had a weak urinary stream? Less than half the time     How often have you had to strain to start urination? Less than 1 in 5 times     How many times did you typically get up at night to urinate? 4 Times     Total IPSS Score 14       Quality of Life due to urinary symptoms   If you were to spend the rest of your life with your urinary condition just the way it is now how would you feel about that? Mostly Disatisfied                Score:  1-7 Mild 8-19 Moderate 20-35 Severe   Patient still having spontaneous erections.     He denies any pain or curvature with  erections.      SHIM     Row Name 08/18/22 1512         SHIM: Over the last 6 months:   How do you rate your confidence that you could get and keep an erection? High     When you had erections with sexual stimulation, how often were your erections hard enough for penetration (entering your partner)? Almost Always or Always     During sexual intercourse, how often were you able to maintain your erection after you had penetrated (entered) your partner? Almost Always or Always     During sexual intercourse, how difficult was it to maintain your erection to completion of intercourse? Not Difficult     When you attempted sexual intercourse, how often was it satisfactory for you? Almost Always or Always       SHIM Total Score   SHIM 24               Score: 1-7 Severe ED 8-11 Moderate ED 12-16 Mild-Moderate ED 17-21 Mild ED 22-25 No ED  PMH: Past Medical History:  Diagnosis Date   GERD (gastroesophageal reflux disease) 03/13/2014   Gout 03/13/2014   Hyperlipidemia 03/13/2014    Surgical History: Past Surgical History:  Procedure Laterality Date   TONSILECTOMY, ADENOIDECTOMY, BILATERAL MYRINGOTOMY AND TUBES      Home Medications:  Allergies as of 08/18/2022   No Known Allergies      Medication List        Accurate as of August 18, 2022 11:59 PM. If you have any questions, ask your nurse or doctor.          Gemtesa 75 MG Tabs Generic drug: Vibegron Take 75 mg by mouth daily. Started by: Zara Council, PA-C   simvastatin 10 MG tablet Commonly known as: ZOCOR TAKE 1 TABLET BY MOUTH NIGHTLY   tamsulosin 0.4 MG Caps capsule Commonly known as: FLOMAX Take 1 capsule (0.4 mg total) by mouth daily.        Allergies: No Known Allergies  Family History: Family History  Problem Relation Age of Onset   Prostate cancer Neg Hx    Bladder Cancer Neg Hx    Kidney cancer Neg Hx     Social History:  reports that he has been smoking. He has been exposed to  tobacco smoke. He has never used smokeless tobacco. He reports that he does not drink alcohol and does not use drugs.  ROS: Pertinent ROS in HPI  Physical Exam: BP (!) 126/91   Pulse (!) 101   Ht 5\' 6"  (1.676 m)   Wt 276 lb (125.2 kg)   BMI 44.55 kg/m   Constitutional:  Well nourished. Alert and oriented, No acute distress. HEENT: [place] AT, moist mucus membranes.  Trachea midline Cardiovascular: No clubbing, cyanosis, or edema. Respiratory: Normal respiratory effort, no increased work of breathing. Neurologic: Grossly intact, no focal deficits, moving all 4 extremities. Psychiatric: Normal mood and affect.   Laboratory Data: Serum creatinine (06/2022) 1.1 Total cholesterol (06/2022) 164 Hemoglobin/hematocrit (06/2022) 16.0/49.1 Uric acid (06/2022) 8.2 I have reviewed the labs.    Pertinent Imaging: N/A   Assessment & Plan:    1. BPH with LUTS -PSA stable --most bothersome symptoms are urgency and nocturia -continue conservative management, avoiding bladder irritants and timed voiding's -take the tamsulosin twice daily  2. Urgency -if he does not find the tamsulosin twice daily effective in controlling his nocturia and urgency, he will start the Gemtesa and contact me to schedule follow up appointment one month after he starts the samples    Return for 1 year for for ipss, shim, pvr and exam.  These notes generated with voice recognition software. I apologize for typographical errors.  [place], [place] [address]  [place], [place] [address] [phone]

## 2022-08-18 ENCOUNTER — Encounter: Payer: Self-pay | Admitting: Urology

## 2022-08-18 ENCOUNTER — Ambulatory Visit (INDEPENDENT_AMBULATORY_CARE_PROVIDER_SITE_OTHER): Payer: Managed Care, Other (non HMO) | Admitting: Urology

## 2022-08-18 VITALS — BP 126/91 | HR 101 | Ht 66.0 in | Wt 276.0 lb

## 2022-08-18 DIAGNOSIS — N138 Other obstructive and reflux uropathy: Secondary | ICD-10-CM | POA: Diagnosis not present

## 2022-08-18 DIAGNOSIS — R3915 Urgency of urination: Secondary | ICD-10-CM

## 2022-08-18 DIAGNOSIS — N401 Enlarged prostate with lower urinary tract symptoms: Secondary | ICD-10-CM

## 2022-08-18 MED ORDER — TAMSULOSIN HCL 0.4 MG PO CAPS: 0.4000 mg | ORAL_CAPSULE | Freq: Every day | ORAL | 3 refills | Status: DC

## 2022-08-18 MED ORDER — GEMTESA 75 MG PO TABS: 75.0000 mg | ORAL_TABLET | Freq: Every day | ORAL | 0 refills | Status: DC

## 2023-08-15 NOTE — Progress Notes (Signed)
 08/17/2023 2:50 PM   Jasmine R Pusch [birth year] [id number]  Referring provider: Jeffie Cheryl BRAVO, MD [address] [place],  [place] [address]  Urological history 1. BPH with LU TS -PSA (07/2023) 2.98 -tamsulosin  0.4 mg twice daily  2.  Nephrolithiasis -Spontaneous passage of the left UVJ stone in 2018  3. ED -Contributing factors of age, BPH, hyperlipidemia and smoking  Benign Prostatic Hypertrophy  HPI: Juan Christian is a 63 y.o. male who presents today for yearly visit.  Previous records reviewed.     I PSS 9/3  UA negative   PVR 51 mL   He is still having urinary urgency.   The Gemtesa  was too effective as he almost went into retention.   Patient denies any modifying or aggravating factors.  Patient denies any recent UTI's, gross hematuria, dysuria or suprapubic/flank pain.  Patient denies any fevers, chills, nausea or vomiting.     IPSS     Row Name 08/17/23 1400         International Prostate Symptom Score   How often have you had the sensation of not emptying your bladder? Less than 1 in 5     How often have you had to urinate less than every two hours? About half the time     How often have you found you stopped and started again several times when you urinated? Not at All     How often have you found it difficult to postpone urination? Less than 1 in 5 times     How often have you had a weak urinary stream? Less than 1 in 5 times     How often have you had to strain to start urination? Not at All     How many times did you typically get up at night to urinate? 3 Times     Total IPSS Score 9       Quality of Life due to urinary symptoms   If you were to spend the rest of your life with your urinary condition just the way it is now how would you feel about that? Mixed                 Score:  1-7 Mild 8-19 Moderate 20-35 Severe  SHIM 20   Patient still having spontaneous erections.  He denies any pain or curvature with erections.      SHIM     Row Name 08/17/23 1420         SHIM: Over the last 6 months:   How do you rate your confidence that you could get and keep an erection? High     When you had erections with sexual stimulation, how often were your erections hard enough for penetration (entering your partner)? Most Times (much more than half the time)     During sexual intercourse, how often were you able to maintain your erection after you had penetrated (entered) your partner? Most Times (much more than half the time)     During sexual intercourse, how difficult was it to maintain your erection to completion of intercourse? Slightly Difficult     When you attempted sexual intercourse, how often was it satisfactory for you? Most Times (much more than half the time)       SHIM Total Score   SHIM 20                Score: 1-7 Severe ED 8-11 Moderate ED 12-16 Mild-Moderate ED  17-21 Mild ED 22-25 No ED    PMH: Past Medical History:  Diagnosis Date   GERD (gastroesophageal reflux disease) 03/13/2014   Gout 03/13/2014   Hyperlipidemia 03/13/2014    Surgical History: Past Surgical History:  Procedure Laterality Date   TONSILECTOMY, ADENOIDECTOMY, BILATERAL MYRINGOTOMY AND TUBES      Home Medications:  Allergies as of 08/17/2023   No Known Allergies      Medication List        Accurate as of August 17, 2023  2:50 PM. If you have any questions, ask your nurse or doctor.          STOP taking these medications    Gemtesa  75 MG Tabs Generic drug: Vibegron  Stopped by: CLOTILDA Tanvir Hipple       TAKE these medications    oxybutynin  10 MG 24 hr tablet Commonly known as: DITROPAN -XL Take 1 tablet (10 mg total) by mouth daily. Started by: CLOTILDA CORNWALL   simvastatin 10 MG tablet Commonly known as: ZOCOR TAKE 1 TABLET BY MOUTH NIGHTLY   tamsulosin  0.4 MG Caps capsule Commonly known as: FLOMAX  Take 1 capsule (0.4 mg total) by mouth daily.        Allergies: No Known  Allergies  Family History: Family History  Problem Relation Age of Onset   Prostate cancer Neg Hx    Bladder Cancer Neg Hx    Kidney cancer Neg Hx     Social History:  reports that he has been smoking. He has been exposed to tobacco smoke. He has never used smokeless tobacco. He reports that he does not drink alcohol and does not use drugs.  ROS: Pertinent ROS in HPI  Physical Exam: BP (!) 145/101   Pulse (!) 108   Ht 5' 6 (1.676 m)   Wt 276 lb (125.2 kg)   BMI 44.55 kg/m   Constitutional:  Well nourished. Alert and oriented, No acute distress. HEENT: [place] AT, moist mucus membranes.  Trachea midline Cardiovascular: No clubbing, cyanosis, or edema. Respiratory: Normal respiratory effort, no increased work of breathing. GU: No CVA tenderness.  No bladder fullness or masses.  Patient with circumcised phallus.   Urethral meatus is patent.  No penile discharge. No penile lesions or rashes. Scrotum without lesions, cysts, rashes and/or edema.  Testicles are located scrotally bilaterally. No masses are appreciated in the testicles. Left and right epididymis are normal. Rectal: Patient with  normal sphincter tone. Anus and perineum without scarring or rashes. No rectal masses are appreciated. Prostate is approximately 50 grams, could not palpate the entire gland, no nodules are appreciated. Seminal vesicles cannot be palpated. Neurologic: Grossly intact, no focal deficits, moving all 4 extremities. Psychiatric: Normal mood and affect.   Laboratory Data: CBC w/auto Differential (3 Part) Order: [id number] Component Ref Range & Units 1 mo ago  WBC (White Blood Cell Count) 4.1 - 10.2 10^3/uL 7.9  RBC (Red Blood Cell Count) 4.69 - 6.13 10^6/uL 5.6  Hemoglobin 14.1 - 18.1 gm/dL 16  Hematocrit 59.9 - 47.9 % 49.3  MCV (Mean Corpuscular Volume) 80.0 - 100.0 fl 88  MCH (Mean Corpuscular Hemoglobin) 27.0 - 31.2 pg 28.6  MCHC (Mean Corpuscular Hemoglobin Concentration) 32.0 - 36.0 gm/dL  67.4  Platelet Count 849 - 450 10^3/uL 194  RDW-CV (Red Cell Distribution Width) 11.6 - 14.8 % 13.1  MPV (Mean Platelet Volume) 9.4 - 12.4 fl 11.2  Neutrophils 1.50 - 7.80 10^3/uL 4.7  Lymphocytes 1.00 - 3.60 10^3/uL 2.3  Mixed Count 0.10 - 0.90 10^3/uL  0.9  Neutrophil % 32.0 - 70.0 % 59.8  Lymphocyte % 10.0 - 50.0 % 29.4  Mixed % 3.0 - 14.4 % 10.8  Resulting Agency [facility]   Specimen Collected: 07/12/23 15:38   Performed by: [facility] Last Resulted: 07/12/23 16:16  Received From: [facility]  Result Received: 07/17/23 13:02   Comprehensive Metabolic Panel (CMP) Order: [id number] Component Ref Range & Units 1 mo ago  Glucose 70 - 110 mg/dL 90  Sodium 863 - 854 mmol/L 139  Potassium 3.6 - 5.1 mmol/L 4.6  Chloride 97 - 109 mmol/L 102  Carbon Dioxide (CO2) 22.0 - 32.0 mmol/L 26.8  Urea Nitrogen (BUN) 7 - 25 mg/dL 14  Creatinine 0.7 - 1.3 mg/dL 1  Glomerular Filtration Rate (eGFR) >60 mL/min/1.73sq m 86  Comment: CKD-EPI (2021) does not include patient's race in the calculation of eGFR.  Monitoring changes of plasma creatinine and eGFR over time is useful for monitoring kidney function.  Interpretive Ranges for eGFR (CKD-EPI 2021):  eGFR:       >60 mL/min/1.73 sq. m - Normal eGFR:       30-59 mL/min/1.73 sq. m - Moderately Decreased eGFR:       15-29 mL/min/1.73 sq. m  - Severely Decreased eGFR:       < 15 mL/min/1.73 sq. m  - Kidney Failure   Note: These eGFR calculations do not apply in acute situations when eGFR is changing rapidly or patients on dialysis.  Calcium 8.7 - 10.3 mg/dL 9.5  AST 8 - 39 U/L 33  ALT 6 - 57 U/L 46  Alk Phos (alkaline Phosphatase) 34 - 104 U/L 81  Albumin 3.5 - 4.8 g/dL 4.4  Bilirubin, Total 0.3 - 1.2 mg/dL 0.5  Protein, Total 6.1 - 7.9 g/dL 6.9  A/G Ratio 1.0 - 5.0 gm/dL 1.8  Resulting Agency [facility]   Specimen Collected: 07/12/23 15:38    Performed by: [facility] Last Resulted: 07/13/23 14:41  Received From: [facility]  Result Received: 07/17/23 13:02   Lipid Panel w/calc LDL Order: [id number] Component Ref Range & Units 1 mo ago  Cholesterol, Total 100 - 200 mg/dL 800  Triglyceride 35 - 199 mg/dL 806  HDL (High Density Lipoprotein) Cholesterol 29.0 - 71.0 mg/dL 50.7  LDL Calculated 0 - 130 mg/dL 888  VLDL Cholesterol mg/dL 39  Cholesterol/HDL Ratio 4  Resulting Agency [facility]   Specimen Collected: 07/12/23 15:38   Performed by: [facility] Last Resulted: 07/13/23 14:41  Received From: [facility]  Result Received: 07/17/23 13:02   PSA, Total (Screen) Order: [id number] Component Ref Range & Units 1 mo ago  PSA (Prostate Specific Antigen), Total 0.10 - 4.00 ng/mL 2.98  Resulting Agency [facility]  Narrative Performed by [facility] Test results were determined with Beckman Coulter Hybritech Assay. Values obtained with different assay methods cannot be used interchangeably in serial testing. Assay results should not be interpreted as absolute evidence of the presence or absence of malignant disease  Specimen Collected: 07/12/23 15:38   Performed by: [facility] Last Resulted: 07/13/23 14:41  Received From: [facility]  Result Received: 07/17/23 13:02   Thyroid Stimulating-Hormone (TSH) Order: [id number] Component Ref Range & Units 1 mo ago  Thyroid Stimulating Hormone (TSH) 0.450-5.330 uIU/ml uIU/mL 3.284  Resulting Agency [facility]   Specimen Collected: 07/12/23 15:38   Performed by: [facility] Last Resulted: 07/13/23 14:41  Received From: [facility]  Result Received: 07/17/23 13:02   Uric Acid Order: [id number] Component Ref Range & Units 1 mo ago  Uric Acid 4.4 - 7.6 mg/dL 6.7  Resulting Agency  [facility]   Specimen Collected: 07/12/23 15:38   Performed by: [facility] Last Resulted: 07/13/23 14:41  Received From: [facility]  Result Received: 07/17/23 13:02    Urinalysis See HPI and EPIC  I have reviewed the labs.    Pertinent Imaging: N/A   Assessment & Plan:    1. BPH with LUTS -PSA stable -most bothersome symptoms are urgency and nocturia -continue conservative management, avoiding bladder irritants and timed voiding's -take the tamsulosin  twice daily  2. Urgency -Gemtesa  was effective, but then he is finding it difficult to urinate at night -will have a trial of oxybutynin  XL 10 mg daily, advised of side effects   Return in about 12 weeks (around 11/09/2023) for I PSS and PVR .  These notes generated with voice recognition software. I apologize for typographical errors.  CLOTILDA HELON RIGGERS  [facility] [address]  [address] [place], [place] [address] [phone]

## 2023-08-17 ENCOUNTER — Encounter: Payer: Self-pay | Admitting: Urology

## 2023-08-17 ENCOUNTER — Ambulatory Visit (INDEPENDENT_AMBULATORY_CARE_PROVIDER_SITE_OTHER): Payer: Managed Care, Other (non HMO) | Admitting: Urology

## 2023-08-17 VITALS — BP 145/101 | HR 108 | Ht 66.0 in | Wt 276.0 lb

## 2023-08-17 DIAGNOSIS — R3915 Urgency of urination: Secondary | ICD-10-CM | POA: Diagnosis not present

## 2023-08-17 DIAGNOSIS — N401 Enlarged prostate with lower urinary tract symptoms: Secondary | ICD-10-CM | POA: Diagnosis not present

## 2023-08-17 DIAGNOSIS — N138 Other obstructive and reflux uropathy: Secondary | ICD-10-CM

## 2023-08-17 LAB — URINALYSIS, COMPLETE
Bilirubin, UA: NEGATIVE
Glucose, UA: NEGATIVE
Ketones, UA: NEGATIVE
Leukocytes,UA: NEGATIVE
Nitrite, UA: NEGATIVE
Protein,UA: NEGATIVE
RBC, UA: NEGATIVE
Specific Gravity, UA: 1.01 (ref 1.005–1.030)
Urobilinogen, Ur: 0.2 mg/dL (ref 0.2–1.0)
pH, UA: 6 (ref 5.0–7.5)

## 2023-08-17 LAB — MICROSCOPIC EXAMINATION
Bacteria, UA: NONE SEEN
RBC, Urine: NONE SEEN /[HPF] (ref 0–2)

## 2023-08-17 LAB — BLADDER SCAN AMB NON-IMAGING: PVR: 51 WU

## 2023-08-17 MED ORDER — OXYBUTYNIN CHLORIDE ER 10 MG PO TB24: 10.0000 mg | ORAL_TABLET | Freq: Every day | ORAL | 1 refills | Status: DC

## 2023-08-17 MED ORDER — TAMSULOSIN HCL 0.4 MG PO CAPS: 0.4000 mg | ORAL_CAPSULE | Freq: Every day | ORAL | 3 refills | Status: AC

## 2023-11-15 NOTE — Progress Notes (Unsigned)
 11/16/2023 3:11 PM   Juan Christian 11-14-1960 [id number]  Referring provider: Marina Goodell, MD [address] [place],  [place] [address]  Urological history 1. BPH with LU TS -PSA (07/2023) 2.98 -tamsulosin 0.4 mg twice daily - Gemtesa 75 mg daily caused retention  2.  Nephrolithiasis -Spontaneous passage of the left UVJ stone in 2018  3. ED -Contributing factors of age, BPH, hyperlipidemia and smoking  Follow-up  HPI: Juan Christian is a 63 y.o. male who presents today for 12-week follow-up visit after a trial of oxybutynin XL 10 mg daily.  Previous records reviewed.     I PSS 10/2  PVR 34 mL   He has noticed significant improvement in his urinary symptoms.   He denied any significant dry mouth, dry eyes or constipation.  He wants to continue the oxybutynin.  Patient denies any modifying or aggravating factors.  Patient denies any recent UTI's, gross hematuria, dysuria or suprapubic/flank pain.  Patient denies any fevers, chills, nausea or vomiting.     IPSS     Row Name 11/16/23 1400         International Prostate Symptom Score   How often have you had the sensation of not emptying your bladder? Less than 1 in 5     How often have you had to urinate less than every two hours? About half the time     How often have you found you stopped and started again several times when you urinated? Less than 1 in 5 times     How often have you found it difficult to postpone urination? Less than half the time     How often have you had a weak urinary stream? Less than 1 in 5 times     How often have you had to strain to start urination? Not at All     How many times did you typically get up at night to urinate? 2 Times     Total IPSS Score 10       Quality of Life due to urinary symptoms   If you were to spend the rest of your life with your urinary condition just the way it is now how would you feel about that? Mostly Satisfied                  Score:   1-7 Mild 8-19 Moderate 20-35 Severe    PMH: Past Medical History:  Diagnosis Date   GERD (gastroesophageal reflux disease) 03/13/2014   Gout 03/13/2014   Hyperlipidemia 03/13/2014    Surgical History: Past Surgical History:  Procedure Laterality Date   TONSILECTOMY, ADENOIDECTOMY, BILATERAL MYRINGOTOMY AND TUBES      Home Medications:  Allergies as of 11/16/2023   No Known Allergies      Medication List        Accurate as of November 16, 2023  3:11 PM. If you have any questions, ask your nurse or doctor.          allopurinol 100 MG tablet Commonly known as: ZYLOPRIM Take 1 tablet by mouth daily.   oxybutynin 10 MG 24 hr tablet Commonly known as: DITROPAN-XL Take 1 tablet (10 mg total) by mouth daily.   simvastatin 10 MG tablet Commonly known as: ZOCOR TAKE 1 TABLET BY MOUTH NIGHTLY   tamsulosin 0.4 MG Caps capsule Commonly known as: FLOMAX Take 1 capsule (0.4 mg total) by mouth daily.        Allergies: No Known  Allergies  Family History: Family History  Problem Relation Age of Onset   Prostate cancer Neg Hx    Bladder Cancer Neg Hx    Kidney cancer Neg Hx     Social History:  reports that he has been smoking. He has been exposed to tobacco smoke. He has never used smokeless tobacco. He reports that he does not drink alcohol and does not use drugs.  ROS: Pertinent ROS in HPI  Physical Exam: BP 133/88   Pulse (!) 4   Constitutional:  Well nourished. Alert and oriented, No acute distress. HEENT: [place] AT, moist mucus membranes.  Trachea midline, no masses. Cardiovascular: No clubbing, cyanosis, or edema. Respiratory: Normal respiratory effort, no increased work of breathing. Neurologic: Grossly intact, no focal deficits, moving all 4 extremities. Psychiatric: Normal mood and affect.   Laboratory Data: N/A   Pertinent Imaging:  11/16/23 14:48  Scan Result 34 ml     Assessment & Plan:    1. BPH with LUTS -PSA stable -most bothersome  symptoms are urgency and nocturia -continue conservative management, avoiding bladder irritants and timed voiding's -take the tamsulosin twice daily  2. Urgency -continue oxybutynin XL 10 mg daily  Return in about 1 year (around 11/15/2024) for PSA , I PSS, SHIM ,PVR and exam .  These notes generated with voice recognition software. I apologize for typographical errors.  Cloretta Ned  [facility] [address]  [address] [place], [place] [address] [phone]

## 2023-11-16 ENCOUNTER — Ambulatory Visit (INDEPENDENT_AMBULATORY_CARE_PROVIDER_SITE_OTHER): Payer: Self-pay | Admitting: Urology

## 2023-11-16 ENCOUNTER — Encounter: Payer: Self-pay | Admitting: Urology

## 2023-11-16 VITALS — BP 133/88 | HR 4

## 2023-11-16 DIAGNOSIS — N401 Enlarged prostate with lower urinary tract symptoms: Secondary | ICD-10-CM

## 2023-11-16 DIAGNOSIS — R3915 Urgency of urination: Secondary | ICD-10-CM | POA: Diagnosis not present

## 2023-11-16 DIAGNOSIS — N138 Other obstructive and reflux uropathy: Secondary | ICD-10-CM | POA: Diagnosis not present

## 2023-11-16 LAB — BLADDER SCAN AMB NON-IMAGING: Scan Result: 34

## 2023-11-16 MED ORDER — OXYBUTYNIN CHLORIDE ER 10 MG PO TB24: 10.0000 mg | ORAL_TABLET | Freq: Every day | ORAL | 3 refills | Status: AC

## 2024-07-23 ENCOUNTER — Other Ambulatory Visit: Payer: Self-pay | Admitting: Neurology

## 2024-07-23 DIAGNOSIS — H539 Unspecified visual disturbance: Secondary | ICD-10-CM

## 2024-07-23 DIAGNOSIS — R519 Headache, unspecified: Secondary | ICD-10-CM

## 2024-07-23 DIAGNOSIS — R2689 Other abnormalities of gait and mobility: Secondary | ICD-10-CM

## 2024-07-25 ENCOUNTER — Ambulatory Visit: Admission: RE | Admit: 2024-07-25 | Source: Ambulatory Visit

## 2024-07-25 DIAGNOSIS — H539 Unspecified visual disturbance: Secondary | ICD-10-CM

## 2024-07-25 DIAGNOSIS — R2689 Other abnormalities of gait and mobility: Secondary | ICD-10-CM

## 2024-07-25 DIAGNOSIS — R519 Headache, unspecified: Secondary | ICD-10-CM

## 2024-11-07 ENCOUNTER — Other Ambulatory Visit

## 2024-11-14 ENCOUNTER — Ambulatory Visit: Admitting: Urology
